# Patient Record
Sex: Male | Born: 1995 | Race: White | Hispanic: No | Marital: Single | Smoking: Never smoker
Health system: Southern US, Community
[De-identification: ages and names within clinical notes are randomized; demographics above are authoritative.]

## PROBLEM LIST (undated history)

## (undated) DIAGNOSIS — F23 Brief psychotic disorder: Secondary | ICD-10-CM

---
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
## 2017-07-28 ENCOUNTER — Encounter (HOSPITAL_COMMUNITY): Payer: Self-pay | Admitting: Emergency Medicine

## 2017-07-28 ENCOUNTER — Emergency Department (HOSPITAL_COMMUNITY)
Admission: EM | Admit: 2017-07-28 | Discharge: 2017-07-28 | Discharge status: Against Medical Advice | Payer: Commercial Managed Care - PPO | Attending: Emergency Medicine | Admitting: Emergency Medicine

## 2017-07-28 DIAGNOSIS — Z5321 Procedure and treatment not carried out due to patient leaving prior to being seen by health care provider: Secondary | ICD-10-CM | POA: Diagnosis not present

## 2017-07-28 DIAGNOSIS — R42 Dizziness and giddiness: Secondary | ICD-10-CM | POA: Diagnosis present

## 2017-07-28 NOTE — ED Triage Notes (Signed)
Pt reports feeling lightheaded and having "tunnel vision" at 10 am today while sitting in class, pt reports he has had episodes of this before, also reports headache after. Pt states symptoms have subsided. A/ox4, resp e/u, nad.

## 2017-07-28 NOTE — ED Notes (Signed)
Family stated that patient did not want to be seen. He is with his family and in the car. Reports that they will come back.

## 2019-09-28 ENCOUNTER — Ambulatory Visit: Payer: Commercial Managed Care - PPO | Admitting: Family Medicine

## 2020-01-30 ENCOUNTER — Encounter: Payer: Self-pay | Admitting: Dermatology

## 2020-01-30 ENCOUNTER — Other Ambulatory Visit: Payer: Self-pay

## 2020-01-30 ENCOUNTER — Ambulatory Visit (INDEPENDENT_AMBULATORY_CARE_PROVIDER_SITE_OTHER): Payer: Commercial Managed Care - PPO | Admitting: Dermatology

## 2020-01-30 DIAGNOSIS — L700 Acne vulgaris: Secondary | ICD-10-CM | POA: Diagnosis not present

## 2020-01-30 MED ORDER — ISOTRETINOIN 40 MG PO CAPS: 40.0000 mg | ORAL_CAPSULE | Freq: Every day | ORAL | 0 refills | Status: AC

## 2020-01-30 NOTE — Progress Notes (Signed)
   Follow-Up Visit   Subjective  Jacob [name] is a 24 y.o. male who presents for the following: Acne (31 day isotret f/u). Acne Patient is being treated with oral isotretinoin.  The patient has lesions in the following areas: face. The patient reports the following side effects: dry lips.  Oral isotretinoin dose is 40mg  daily.   The following portions of the chart were reviewed this encounter and updated as appropriate: Allergies  Meds  Problems  Med Hx  Surg Hx  Fam Hx      Isotretinoin F/U - 01/30/20 0700      Isotretinoin Follow Up   iPledge #  02/01/20   ipledge update "Qualified"   Date  01/30/20    Acne breakouts since last visit?  No      Dosage   Target Dosage (mg)  8181    Current (To Date) Dosage (mg)  4800    To Go Dosage (mg)  3381      Side Effects   Skin  Dry Lips    Gastrointestinal  WNL    Neurological  WNL    Constitutional  WNL    Other Side Effects  no       Objective  Well appearing patient in no apparent distress; mood and affect are within normal limits.  A focused examination was performed including face. Relevant physical exam findings are noted in the Assessment and Plan.  Assessment & Plan  Acne vulgaris (2) Left Buccal Cheek ; Right Buccal Cheek   Return one month.  ISOtretinoin (ACCUTANE) 40 MG capsule - Left Buccal Cheek , Right Buccal Cheek  Patient is compliant with his medication takes his isotretinoin with supper daily examination showed essentially complete clearance of his acne but severe cheilitis of his lips.  Patient encouraged to use daily lip balm.  Discussed use of sunscreen as we get into sunnier months.  No change in dosing.  No active affective mood disorders reported.

## 2020-03-05 ENCOUNTER — Ambulatory Visit: Payer: Commercial Managed Care - PPO | Admitting: Dermatology

## 2020-03-12 ENCOUNTER — Encounter: Payer: Self-pay | Admitting: Dermatology

## 2020-03-12 ENCOUNTER — Ambulatory Visit (INDEPENDENT_AMBULATORY_CARE_PROVIDER_SITE_OTHER): Payer: Commercial Managed Care - PPO | Admitting: Dermatology

## 2020-03-12 ENCOUNTER — Other Ambulatory Visit: Payer: Self-pay

## 2020-03-12 DIAGNOSIS — L700 Acne vulgaris: Secondary | ICD-10-CM | POA: Diagnosis not present

## 2020-03-12 DIAGNOSIS — L906 Striae atrophicae: Secondary | ICD-10-CM

## 2020-03-12 MED ORDER — ISOTRETINOIN 40 MG PO CAPS: 40.0000 mg | ORAL_CAPSULE | Freq: Every day | ORAL | 0 refills | Status: DC

## 2020-03-12 NOTE — Progress Notes (Signed)
   Follow-Up Visit   Subjective  Jacob [name] is a 24 y.o. male who presents for the following: Acne (31 isotret f/u).  Acne Location: Face more than torso Duration: Several years Quality: Most clear Associated Signs/Symptoms: Modifying Factors: Isotretinoin Severity:  Timing: Context:   The following portions of the chart were reviewed this encounter and updated as appropriate:     Objective  Well appearing patient in no apparent distress; mood and affect are within normal limits.  A focused examination was performed including face, neck, arms, back, chest. Relevant physical exam findings are noted in the Assessment and Plan. Active acne is essentially clear on the face and torso.  The pink horizontal lines on his lower mid back represent striae (stretch marks).  These will tend to slowly fade over several years.  If they do not, we discussed the possibility post Accutane of using some topical tretinoin.  Emphasized need for sunscreen while on isotretinoin during the spring and summer.  Assessment & Plan  continue isotretinoin

## 2020-03-14 ENCOUNTER — Ambulatory Visit: Payer: Commercial Managed Care - PPO | Admitting: Dermatology

## 2020-03-15 ENCOUNTER — Encounter: Payer: Self-pay | Admitting: Dermatology

## 2020-04-16 ENCOUNTER — Encounter: Payer: Self-pay | Admitting: Dermatology

## 2020-04-16 ENCOUNTER — Ambulatory Visit (INDEPENDENT_AMBULATORY_CARE_PROVIDER_SITE_OTHER): Payer: Commercial Managed Care - PPO | Admitting: Dermatology

## 2020-04-16 ENCOUNTER — Other Ambulatory Visit: Payer: Self-pay

## 2020-04-16 DIAGNOSIS — L700 Acne vulgaris: Secondary | ICD-10-CM | POA: Diagnosis not present

## 2020-04-16 MED ORDER — ISOTRETINOIN 40 MG PO CAPS: 40.0000 mg | ORAL_CAPSULE | Freq: Every day | ORAL | 0 refills | Status: AC

## 2020-04-22 ENCOUNTER — Encounter: Payer: Self-pay | Admitting: Dermatology

## 2020-04-22 NOTE — Progress Notes (Signed)
   Follow-Up Visit   Subjective  Jacob [name] is a 24 y.o. male who presents for the following: Follow-up (31 days).  Acne Location: Face Duration: Years Quality: Proved Associated Signs/Symptoms: Modifying Factors: Isotretinoin Severity:  Timing: Context:   The following portions of the chart were reviewed this encounter and updated as appropriate: Tobacco  Allergies  Meds  Problems  Med Hx  Surg Hx  Fam Hx      Objective  Well appearing patient in no apparent distress; mood and affect are within normal limits.  A focused examination was performed including Head, neck, and upper torso.. Relevant physical exam findings are noted in the Assessment and Plan.   Assessment & Plan  Acne vulgaris (2) Head - Anterior (Face)  Continue isotretinoin to target dose of at least 120 mg/kg body weight.  Sun protect.  Call with any problems.

## 2020-05-21 ENCOUNTER — Ambulatory Visit: Payer: Commercial Managed Care - PPO | Admitting: Dermatology

## 2023-05-09 ENCOUNTER — Emergency Department (HOSPITAL_COMMUNITY): Payer: Commercial Managed Care - PPO

## 2023-05-09 ENCOUNTER — Emergency Department (HOSPITAL_COMMUNITY)
Admission: EM | Admit: 2023-05-09 | Discharge: 2023-05-11 | Disposition: A | Discharge status: Home / Self Care | Payer: Commercial Managed Care - PPO | Attending: Emergency Medicine | Admitting: Emergency Medicine

## 2023-05-09 ENCOUNTER — Other Ambulatory Visit: Payer: Self-pay

## 2023-05-09 ENCOUNTER — Encounter (HOSPITAL_COMMUNITY): Payer: Self-pay | Admitting: Emergency Medicine

## 2023-05-09 DIAGNOSIS — F23 Brief psychotic disorder: Secondary | ICD-10-CM | POA: Insufficient documentation

## 2023-05-09 DIAGNOSIS — R4182 Altered mental status, unspecified: Secondary | ICD-10-CM | POA: Diagnosis present

## 2023-05-09 DIAGNOSIS — X58XXXA Exposure to other specified factors, initial encounter: Secondary | ICD-10-CM | POA: Insufficient documentation

## 2023-05-09 DIAGNOSIS — S40212A Abrasion of left shoulder, initial encounter: Secondary | ICD-10-CM | POA: Insufficient documentation

## 2023-05-09 DIAGNOSIS — Z91040 Latex allergy status: Secondary | ICD-10-CM | POA: Insufficient documentation

## 2023-05-09 DIAGNOSIS — F12159 Cannabis abuse with psychotic disorder, unspecified: Secondary | ICD-10-CM | POA: Diagnosis not present

## 2023-05-09 DIAGNOSIS — F419 Anxiety disorder, unspecified: Secondary | ICD-10-CM | POA: Diagnosis not present

## 2023-05-09 DIAGNOSIS — F12959 Cannabis use, unspecified with psychotic disorder, unspecified: Secondary | ICD-10-CM | POA: Diagnosis present

## 2023-05-09 DIAGNOSIS — S4992XA Unspecified injury of left shoulder and upper arm, initial encounter: Secondary | ICD-10-CM | POA: Diagnosis present

## 2023-05-09 LAB — URINALYSIS, ROUTINE W REFLEX MICROSCOPIC
Bilirubin Urine: NEGATIVE
Glucose, UA: NEGATIVE mg/dL
Hgb urine dipstick: NEGATIVE
Ketones, ur: 20 mg/dL — AB
Leukocytes,Ua: NEGATIVE
Nitrite: NEGATIVE
Protein, ur: 30 mg/dL — AB
Specific Gravity, Urine: 1.011 (ref 1.005–1.030)
pH: 6 (ref 5.0–8.0)

## 2023-05-09 LAB — CBC WITH DIFFERENTIAL/PLATELET
Abs Immature Granulocytes: 0.06 10*3/uL (ref 0.00–0.07)
Basophils Absolute: 0 10*3/uL (ref 0.0–0.1)
Basophils Relative: 0 %
Eosinophils Absolute: 0 10*3/uL (ref 0.0–0.5)
Eosinophils Relative: 0 %
HCT: 44.1 % (ref 39.0–52.0)
Hemoglobin: 15.1 g/dL (ref 13.0–17.0)
Immature Granulocytes: 0 %
Lymphocytes Relative: 14 %
Lymphs Abs: 2 10*3/uL (ref 0.7–4.0)
MCH: 29.9 pg (ref 26.0–34.0)
MCHC: 34.2 g/dL (ref 30.0–36.0)
MCV: 87.3 fL (ref 80.0–100.0)
Monocytes Absolute: 1.1 10*3/uL — ABNORMAL HIGH (ref 0.1–1.0)
Monocytes Relative: 8 %
Neutro Abs: 11.3 10*3/uL — ABNORMAL HIGH (ref 1.7–7.7)
Neutrophils Relative %: 78 %
Platelets: 198 10*3/uL (ref 150–400)
RBC: 5.05 MIL/uL (ref 4.22–5.81)
RDW: 11.8 % (ref 11.5–15.5)
WBC: 14.5 10*3/uL — ABNORMAL HIGH (ref 4.0–10.5)
nRBC: 0 % (ref 0.0–0.2)

## 2023-05-09 LAB — COMPREHENSIVE METABOLIC PANEL
ALT: 14 U/L (ref 0–44)
AST: 19 U/L (ref 15–41)
Albumin: 5.1 g/dL — ABNORMAL HIGH (ref 3.5–5.0)
Alkaline Phosphatase: 46 U/L (ref 38–126)
Anion gap: 12 (ref 5–15)
BUN: 12 mg/dL (ref 6–20)
CO2: 24 mmol/L (ref 22–32)
Calcium: 9.8 mg/dL (ref 8.9–10.3)
Chloride: 102 mmol/L (ref 98–111)
Creatinine, Ser: 1.08 mg/dL (ref 0.61–1.24)
GFR, Estimated: >60 mL/min (ref >60)
Glucose, Bld: 98 mg/dL (ref 70–99)
Potassium: 3.5 mmol/L (ref 3.5–5.1)
Sodium: 138 mmol/L (ref 135–145)
Total Bilirubin: 1.4 mg/dL — ABNORMAL HIGH (ref 0.3–1.2)
Total Protein: 8 g/dL (ref 6.5–8.1)

## 2023-05-09 LAB — MAGNESIUM: Magnesium: 2.7 mg/dL — ABNORMAL HIGH (ref 1.7–2.4)

## 2023-05-09 LAB — RAPID URINE DRUG SCREEN, HOSP PERFORMED
Amphetamines: NOT DETECTED
Barbiturates: NOT DETECTED
Benzodiazepines: NOT DETECTED
Cocaine: NOT DETECTED
Opiates: NOT DETECTED
Tetrahydrocannabinol: POSITIVE — AB

## 2023-05-09 LAB — ACETAMINOPHEN LEVEL: Acetaminophen (Tylenol), Serum: <10 ug/mL — ABNORMAL LOW (ref 10–30)

## 2023-05-09 LAB — ETHANOL: Alcohol, Ethyl (B): <10 mg/dL (ref <10)

## 2023-05-09 LAB — SALICYLATE LEVEL: Salicylate Lvl: <7 mg/dL — ABNORMAL LOW (ref 7.0–30.0)

## 2023-05-09 MED ORDER — ZIPRASIDONE MESYLATE 20 MG IM SOLR
20.0000 mg | Freq: Once | INTRAMUSCULAR | Status: AC
  Administered 2023-05-09: 20 mg via INTRAMUSCULAR
  Filled 2023-05-09: qty 20

## 2023-05-09 MED ORDER — LORAZEPAM 1 MG PO TABS
1.0000 mg | ORAL_TABLET | ORAL | Status: AC | PRN
  Administered 2023-05-09: 1 mg via ORAL
  Filled 2023-05-09: qty 1

## 2023-05-09 MED ORDER — STERILE WATER FOR INJECTION IJ SOLN
INTRAMUSCULAR | Status: AC
  Administered 2023-05-09: 10 mL
  Filled 2023-05-09: qty 10

## 2023-05-09 MED ORDER — ZIPRASIDONE MESYLATE 20 MG IM SOLR
20.0000 mg | INTRAMUSCULAR | Status: AC | PRN
  Administered 2023-05-09: 20 mg via INTRAMUSCULAR
  Filled 2023-05-09: qty 20

## 2023-05-09 MED ORDER — NICOTINE 21 MG/24HR TD PT24
21.0000 mg | MEDICATED_PATCH | Freq: Once | TRANSDERMAL | Status: AC
  Administered 2023-05-09: 21 mg via TRANSDERMAL
  Filled 2023-05-09: qty 1

## 2023-05-09 MED ORDER — STERILE WATER FOR INJECTION IJ SOLN
INTRAMUSCULAR | Status: AC
  Administered 2023-05-09: 1 mL
  Filled 2023-05-09: qty 10

## 2023-05-09 MED ORDER — LACTATED RINGERS IV BOLUS
1000.0000 mL | Freq: Once | INTRAVENOUS | Status: AC
  Administered 2023-05-09: 1000 mL via INTRAVENOUS

## 2023-05-09 MED ORDER — QUETIAPINE FUMARATE 50 MG PO TABS
50.0000 mg | ORAL_TABLET | Freq: Every day | ORAL | Status: DC
  Administered 2023-05-09 – 2023-05-10 (×2): 50 mg via ORAL
  Filled 2023-05-09 (×2): qty 1

## 2023-05-09 MED ORDER — OLANZAPINE 10 MG PO TBDP
10.0000 mg | ORAL_TABLET | Freq: Three times a day (TID) | ORAL | Status: DC | PRN
  Administered 2023-05-09 – 2023-05-10 (×3): 10 mg via ORAL
  Filled 2023-05-09 (×3): qty 1

## 2023-05-09 NOTE — ED Triage Notes (Signed)
Pt arrives w/ [employer] c/o psychotic episode. Was outside naked. Tearing up items in parents house.

## 2023-05-09 NOTE — ED Notes (Signed)
Patient exposing himself to the safety sitter, he ejaculated twice while in her presence.

## 2023-05-09 NOTE — ED Notes (Addendum)
Pt requested to call his dad. I told him in order to do that he needed to calm down. Pt has been pacing and starring at the door. He has been standing in the doorway and nervously looking around. Pt would have to come to the nurse desk to make a call.

## 2023-05-09 NOTE — ED Provider Notes (Signed)
[place] EMERGENCY DEPARTMENT AT [facility] Provider Note   CSN: [id number] Arrival date & time: 05/09/23  4098     History  Chief Complaint  Patient presents with   Psychiatric Evaluation   IVC    Mukhtar Shams is a 27 y.o. male.  HPI Patient presents for altered mental status.  Medical history includes anxiety, depression.  Police were called to his home this morning due to strange behavior.  Patient currently lives with his parents.  He does take psychiatric medications, which parents report he has been compliant to.  Patient was destroying items in his home.  He was subsequently found outside running around naked.  He arrives in the ED via PD.  Patient currently denies any physical complaints.  He endorses marijuana use but denies any recently.  He does endorse auditory hallucinations.  History per father: Patient has had similar episodes in the past.  Patient was in his normal state of health last week.  He went out of town with some friends.  On Friday, he called home and did not sound like his normal self.  He was brought back home yesterday and was able to sleep.  He woke up early this morning screaming about his wallet.  He then proceeded to have severe agitation and destructive behaviors.  Patient is not currently working or going to school.     Home Medications Prior to Admission medications   Medication Sig Start Date End Date Taking? Authorizing Provider  ISOtretinoin (CLARAVIS) 40 MG capsule Take 1 capsule (40 mg total) by mouth daily. 03/12/20   Janalyn Harder, MD      Allergies    Latex    Review of Systems   Review of Systems  Psychiatric/Behavioral:  Positive for agitation, behavioral problems and hallucinations. The patient is nervous/anxious.   All other systems reviewed and are negative.   Physical Exam Updated Vital Signs BP (!) 148/93 (BP Location: Left Arm)   Pulse 97   Temp 98.1 F (36.7 C) (Oral)   Resp 19   SpO2 97%  Physical  Exam Vitals and nursing note reviewed.  Constitutional:      General: He is not in acute distress.    Appearance: Normal appearance. He is well-developed. He is not ill-appearing, toxic-appearing or diaphoretic.  HENT:     Head: Normocephalic and atraumatic.     Right Ear: External ear normal.     Left Ear: External ear normal.     Nose: Nose normal.     Mouth/Throat:     Mouth: Mucous membranes are moist.  Eyes:     Extraocular Movements: Extraocular movements intact.     Conjunctiva/sclera: Conjunctivae normal.  Cardiovascular:     Rate and Rhythm: Normal rate and regular rhythm.  Pulmonary:     Effort: Pulmonary effort is normal. No respiratory distress.  Abdominal:     General: There is no distension.     Palpations: Abdomen is soft.     Tenderness: There is no abdominal tenderness.  Musculoskeletal:        General: No swelling. Normal range of motion.     Cervical back: Normal range of motion and neck supple.     Right lower leg: No edema.     Left lower leg: No edema.  Skin:    General: Skin is warm and dry.     Coloration: Skin is not jaundiced or pale.     Findings: No bruising.     Comments: Abrasions  to left shoulder  Neurological:     General: No focal deficit present.     Mental Status: He is alert and oriented to person, place, and time.     Cranial Nerves: No cranial nerve deficit.     Sensory: No sensory deficit.     Motor: No weakness.     Coordination: Coordination normal.  Psychiatric:        Attention and Perception: He perceives auditory hallucinations.        Mood and Affect: Mood is anxious.        Speech: Speech normal. Speech is not delayed, slurred or tangential.        Behavior: Behavior is agitated. Behavior is not aggressive or hyperactive. Behavior is cooperative.     ED Results / Procedures / Treatments   Labs (all labs ordered are listed, but only abnormal results are displayed) Labs Reviewed  COMPREHENSIVE METABOLIC PANEL - Abnormal;  Notable for the following components:      Result Value   Albumin 5.1 (*)    Total Bilirubin 1.4 (*)    All other components within normal limits  CBC WITH DIFFERENTIAL/PLATELET - Abnormal; Notable for the following components:   WBC 14.5 (*)    Neutro Abs 11.3 (*)    Monocytes Absolute 1.1 (*)    All other components within normal limits  SALICYLATE LEVEL - Abnormal; Notable for the following components:   Salicylate Lvl <7.0 (*)    All other components within normal limits  ACETAMINOPHEN LEVEL - Abnormal; Notable for the following components:   Acetaminophen (Tylenol), Serum <10 (*)    All other components within normal limits  MAGNESIUM - Abnormal; Notable for the following components:   Magnesium 2.7 (*)    All other components within normal limits  ETHANOL  RAPID URINE DRUG SCREEN, HOSP PERFORMED  URINALYSIS, ROUTINE W REFLEX MICROSCOPIC    EKG EKG Interpretation  Date/Time:  Sunday May 09 2023 06:50:18 EDT Ventricular Rate:  95 PR Interval:  139 QRS Duration: 97 QT Interval:  345 QTC Calculation: 434 R Axis:   80 Text Interpretation: Sinus rhythm Biatrial enlargement RSR' in V1 or V2, probably normal variant Confirmed by Alona Bene [id number]) on 05/09/2023 6:56:50 AM  Radiology CT Head Wo Contrast  Result Date: 05/09/2023 CLINICAL DATA:  Mental status change. EXAM: CT HEAD WITHOUT CONTRAST TECHNIQUE: Contiguous axial images were obtained from the base of the skull through the vertex without intravenous contrast. RADIATION DOSE REDUCTION: This exam was performed according to the departmental dose-optimization program which includes automated exposure control, adjustment of the mA and/or kV according to patient size and/or use of iterative reconstruction technique. COMPARISON:  None Available. FINDINGS: Brain: No evidence of acute infarction, hemorrhage, hydrocephalus, extra-axial collection or mass lesion/mass effect. Vascular: No hyperdense vessel or unexpected  calcification. Skull: Normal. Negative for fracture or focal lesion. Sinuses/Orbits: No acute finding. Other: None. IMPRESSION: No acute intracranial pathology. Electronically Signed   By: Signa Kell M.D.   On: 05/09/2023 08:44    Procedures Procedures    Medications Ordered in ED Medications  OLANZapine zydis (ZYPREXA) disintegrating tablet 10 mg (10 mg Oral Given 05/09/23 0805)    And  LORazepam (ATIVAN) tablet 1 mg (has no administration in time range)    And  ziprasidone (GEODON) injection 20 mg (has no administration in time range)  lactated ringers bolus 1,000 mL (1,000 mLs Intravenous New Bag/Given 05/09/23 6045)    ED Course/ Medical Decision Making/ A&P  Medical Decision Making Amount and/or Complexity of Data Reviewed Labs: ordered. Radiology: ordered.  Risk Prescription drug management.   This patient presents to the ED for concern of altered mental status, this involves an extensive number of treatment options, and is a complaint that carries with it a high risk of complications and morbidity.  The differential diagnosis includes intoxication, drug-induced psychosis, schizophrenia, TBI   Co morbidities that complicate the patient evaluation  Anxiety, depression   Additional history obtained:  Additional history obtained from police, patient's father External records from outside source obtained and reviewed including EMR   Lab Tests:  I Ordered, and personally interpreted labs.  The pertinent results include: Hypomagnesemia with otherwise normal electrolytes, normal hemoglobin, negative alcohol level.  A leukocytosis is present.   Imaging Studies ordered:  I ordered imaging studies including CT head I independently visualized and interpreted imaging which showed no acute findings I agree with the radiologist interpretation   Cardiac Monitoring: / EKG:  The patient was maintained on a cardiac monitor.  I personally  viewed and interpreted the cardiac monitored which showed an underlying rhythm of: Sinus rhythm   Consultations Obtained:  I requested consultation with the TTS,  and discussed lab and imaging findings as well as pertinent plan - they recommend: (Recommendations pending)   Problem List / ED Course / Critical interventions / Medication management  Patient presents for altered behavior.  Law enforcement, who brought him to the ED, reports that he was destroying items in his parents home, where he lives, as well as running outside naked.  On arrival in the ED, patient is awake and alert.  He does appear to be actively responding to internal stimuli.  He does endorse current auditory hallucinations.  Presentation is consistent with acute psychotic episode.  Workup was initiated for medical clearance.  IVC paperwork was completed.  Patient was noted bedside by his father, who was able to provide further history.  Seems like patient was recently out of town with some friends.  It was while out of town that he developed some changes in his behavior.  This was first noticed over telephone on Friday (2 days ago).  Father reports that he has a similar episodes in the past.  Labwork is notable for nonspecific leukocytosis.  Vital signs remain normal.  Patient is medically clear at this time.  TTS was consulted. I ordered medication including IV fluids for hydration; Geodon for agitation and hallucinations Reevaluation of the patient after these medicines showed that the patient improved I have reviewed the patients home medicines and have made adjustments as needed   Social Determinants of Health:  Lives at home with parents         Final Clinical Impression(s) / ED Diagnoses Final diagnoses:  Acute psychosis (HCC)    Rx / DC Orders ED Discharge Orders     None         Gloris Manchester, MD 05/09/23 [phone]

## 2023-05-09 NOTE — ED Notes (Signed)
Security is in pts room. Pt is agitated and upset because he wants to go home.

## 2023-05-09 NOTE — ED Notes (Signed)
Pt asking to go to store or walk around department. Informed he was not allowed. Pt closed door to room, flipped off this RN. Security called for standby assistance. Pt reopened door and hovering in doorway.

## 2023-05-09 NOTE — ED Notes (Signed)
Pt exposed himself to male staff members. Instructed to cease behavior

## 2023-05-09 NOTE — ED Notes (Signed)
Patient requesting "something to help him sleep".  Patient also requesting a toothbrush.  Patient is less agitated, responds appropriately and is cooperative.  Toothbrush and toothpaste provider to patient.  Safety sitter at bedside

## 2023-05-09 NOTE — ED Notes (Signed)
Patient's mom updated on visitor's policy.  Patient's mom states that she understands the policy and she understands that her son needs to stay here to be safe.

## 2023-05-09 NOTE — ED Notes (Signed)
Pt left wrist release from handcuffs for bloodwork and fluids. Right wrist still cuffed to stretcher. Pt compliant at this time.

## 2023-05-09 NOTE — ED Notes (Signed)
Pt ankles released from cuffs. Remains compliant at this time.

## 2023-05-09 NOTE — Consult Note (Signed)
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 male patient admitted with previous hx of Depression and anxiety was brought in by [facility] called by his father after patient was found Naked outside their home and tearing up things around the house.  Marland Kitchen  HPI:  Patient does not have much record to review except a visit in 2019 at [facility] where he was diagnosed with Anxiety and Depression.  At that time in 2019 he prescribed Zoloft..  Patient was seen in the room this morning confused and disorganized.  He denies ever been seen or diagnosed with Mental illness and denied ever taking any Psychotropic medications.  Patient does not remember what led to his coming to the ER and by who.  He looks around the room constantly and laughs inappropriately.  Patient was on Hand cuff on arrival but same has been off.  Patient  exposed his Penis to RN once and wanted to leave the ER but security came by to just monitor the situation.  Patient reports staying up all night and states he does not eat healthy and he eats once a day.  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  He denies illicit drug use but UDS is positive for Cannabis. Collateral from both parents are that this is the third episode of mental breakdown.  They reported that patient suddenly became violent at home, running around outside of their home naked.  His PCP prescribed Quetiapine 100 mg at night for him.  He has no Psychiatrist in the community. We will monitor overnight and reevaluate in am.  Quetiapine 50 mg nightly  will be resumed. Past Psychiatric History: Depression and anxiety  Risk to Self or Others: Is the patient at risk to self? No Has the patient been a risk to self in the past 6 months? No Has the patient been a risk to self within the distant past? No Is the patient a risk to others? No Has the patient been a risk to others in the past 6 months? No Has the patient been a risk to others within the distant past? No  [place] Scale:   AIMS:  , , ,  ,   ASAM:    Substance Abuse:     Past Medical History: History reviewed. No pertinent past medical history. History reviewed. No pertinent surgical history. Family History: History reviewed. No pertinent family history. Family Psychiatric  History: none Social History:  Social History   Substance and Sexual Activity  Alcohol Use None     Social History   Substance and Sexual Activity  Drug Use No    Social History   Socioeconomic History   Marital status: Single    Spouse name: Not on file   Number of children: Not on file   Years of education: Not on file   Highest education level: Not on file  Occupational History   Not on file  Tobacco Use   Smoking status: Never   Smokeless tobacco: Never  Vaping Use   Vaping Use: Every day  Substance and Sexual Activity  Alcohol use: Not on file   Drug use: No   Sexual activity: Not on file  Other Topics Concern   Not on file  Social History Narrative   Not on file   Social Determinants of Health   Financial Resource Strain: Not on file  Food Insecurity: Not on file  Transportation Needs: Not on file  Physical Activity: Not on file  Stress: Not on file  Social Connections: Not on file   Additional Social History:    Allergies:   Allergies  Allergen Reactions   Latex Rash    Labs:  Results for orders placed or performed during the hospital encounter of 05/09/23 (from the past 48 hour(s))  Comprehensive metabolic panel     Status: Abnormal   Collection Time:  05/09/23  8:38 AM  Result Value Ref Range   Sodium 138 135 - 145 mmol/L   Potassium 3.5 3.5 - 5.1 mmol/L   Chloride 102 98 - 111 mmol/L   CO2 24 22 - 32 mmol/L   Glucose, Bld 98 70 - 99 mg/dL    Comment: Glucose reference range applies only to samples taken after fasting for at least 8 hours.   BUN 12 6 - 20 mg/dL   Creatinine, Ser 1.91 0.61 - 1.24 mg/dL   Calcium 9.8 8.9 - 47.8 mg/dL   Total Protein 8.0 6.5 - 8.1 g/dL   Albumin 5.1 (H) 3.5 - 5.0 g/dL   AST 19 15 - 41 U/L   ALT 14 0 - 44 U/L   Alkaline Phosphatase 46 38 - 126 U/L   Total Bilirubin 1.4 (H) 0.3 - 1.2 mg/dL   GFR, Estimated >29 >56 mL/min    Comment: (NOTE) Calculated using the CKD-EPI Creatinine Equation (2021)    Anion gap 12 5 - 15    Comment: Performed at [facility], [address] [place], [place] [address]  Ethanol     Status: None   Collection Time: 05/09/23  8:38 AM  Result Value Ref Range   Alcohol, Ethyl (B) <10 <10 mg/dL    Comment: (NOTE) Lowest detectable limit for serum alcohol is 10 mg/dL.  For medical purposes only. Performed at [facility], [address] [place], [place] [address]   CBC with Diff     Status: Abnormal   Collection Time: 05/09/23  8:38 AM  Result Value Ref Range   WBC 14.5 (H) 4.0 - 10.5 K/uL   RBC 5.05 4.22 - 5.81 MIL/uL   Hemoglobin 15.1 13.0 - 17.0 g/dL   HCT 69.6 29.5 - 28.4 %   MCV 87.3 80.0 - 100.0 fL   MCH 29.9 26.0 - 34.0 pg   MCHC 34.2 30.0 - 36.0 g/dL   RDW 13.2 44.0 - 10.2 %   Platelets 198 150 - 400 K/uL   nRBC 0.0 0.0 - 0.2 %   Neutrophils Relative % 78 %   Neutro Abs 11.3 (H) 1.7 - 7.7 K/uL   Lymphocytes Relative 14 %   Lymphs Abs 2.0 0.7 - 4.0 K/uL   Monocytes Relative 8 %   Monocytes Absolute 1.1 (H) 0.1 - 1.0 K/uL   Eosinophils Relative 0 %   Eosinophils Absolute 0.0 0.0 - 0.5 K/uL   Basophils Relative 0 %   Basophils Absolute 0.0 0.0 - 0.1 K/uL   Immature Granulocytes 0 %   Abs Immature Granulocytes  0.06 0.00 - 0.07 K/uL    Comment: Performed at [facility], [address] [place], [place] [address]  Salicylate level     Status: Abnormal   Collection Time: 05/09/23  8:38 AM  Result Value Ref Range   Salicylate Lvl <7.0 (L) 7.0 - 30.0 mg/dL    Comment: Performed at [facility], [address], [place], [place] [address]  Acetaminophen level     Status: Abnormal   Collection Time: 05/09/23  8:38 AM  Result Value Ref Range   Acetaminophen (Tylenol), Serum <10 (L) 10 - 30 ug/mL    Comment: (NOTE) Therapeutic concentrations vary significantly. A range of 10-30 ug/mL  may be an effective concentration for many patients. However, some  are best treated at concentrations outside of this range. Acetaminophen concentrations >150 ug/mL at 4 hours after ingestion  and >50 ug/mL at 12 hours after ingestion are often associated with  toxic reactions.  Performed at [facility], [address] [place], [place] [address]   Magnesium     Status: Abnormal   Collection Time: 05/09/23  8:38 AM  Result Value Ref Range   Magnesium 2.7 (H) 1.7 - 2.4 mg/dL    Comment: Performed at [facility], [address] [place], [place] [address]  Urine rapid drug screen (hosp performed)     Status: Abnormal   Collection Time: 05/09/23  8:56 AM  Result Value Ref Range   Opiates NONE DETECTED NONE DETECTED   Cocaine NONE DETECTED NONE DETECTED   Benzodiazepines NONE DETECTED NONE DETECTED   Amphetamines NONE DETECTED NONE DETECTED   Tetrahydrocannabinol POSITIVE (A) NONE DETECTED   Barbiturates NONE DETECTED NONE DETECTED    Comment: (NOTE) DRUG SCREEN FOR MEDICAL PURPOSES ONLY.  IF CONFIRMATION IS NEEDED FOR ANY PURPOSE, NOTIFY LAB WITHIN 5 DAYS.  LOWEST DETECTABLE LIMITS FOR URINE DRUG SCREEN Drug Class                     Cutoff (ng/mL) Amphetamine and metabolites    1000 Barbiturate and metabolites     200 Benzodiazepine                 200 Opiates and metabolites        300 Cocaine and metabolites        300 THC                            50 Performed at [facility], [address], [place], [place] [address]   Urinalysis, Routine w reflex microscopic -Urine, Clean Catch     Status: Abnormal   Collection Time: 05/09/23  8:56 AM  Result Value Ref Range   Color, Urine YELLOW YELLOW   APPearance CLEAR CLEAR   Specific Gravity, Urine 1.011 1.005 - 1.030   pH 6.0 5.0 - 8.0   Glucose, UA NEGATIVE NEGATIVE mg/dL   Hgb urine dipstick NEGATIVE NEGATIVE   Bilirubin Urine NEGATIVE NEGATIVE   Ketones, ur 20 (A) NEGATIVE mg/dL   Protein, ur 30 (A) NEGATIVE mg/dL   Nitrite NEGATIVE NEGATIVE   Leukocytes,Ua NEGATIVE NEGATIVE   RBC / HPF 0-5 0 - 5 RBC/hpf   WBC, UA 0-5 0 - 5 WBC/hpf   Bacteria, UA RARE (A) NONE SEEN   Squamous Epithelial / HPF 0-5 0 - 5 /HPF   Mucus PRESENT    Hyaline Casts, UA PRESENT     Comment: Performed at [facility], [address], [place], [place] [address]  Current Facility-Administered Medications  Medication Dose Route Frequency Provider Last Rate Last Admin   nicotine (NICODERM CQ - dosed in mg/24 hours) patch 21 mg  21 mg Transdermal Once Gloris Manchester, MD   21 mg at 05/09/23 1105   OLANZapine zydis (ZYPREXA) disintegrating tablet 10 mg  10 mg Oral Q8H PRN Gloris Manchester, MD   10 mg at 05/09/23 0805   QUEtiapine (SEROQUEL) tablet 50 mg  50 mg Oral QHS Dahlia Byes C, NP       Current Outpatient Medications  Medication Sig Dispense Refill   ISOtretinoin (CLARAVIS) 40 MG capsule Take 1 capsule (40 mg total) by mouth daily. 30 capsule 0    Musculoskeletal: Strength & Muscle Tone: within normal limits Gait & Station: normal Patient leans: Front   Psychiatric Specialty Exam: Presentation  General Appearance:  Neat; Appropriate for Environment; Casual  Eye Contact: Fleeting  Speech: Blocked; Slow  Speech  Volume: Normal  Handedness: Right   Mood and Affect  Mood: Anxious  Affect: Congruent   Thought Process  Thought Processes: Disorganized  Descriptions of Associations:Tangential  Orientation:Partial  Thought Content:Illogical  History of Schizophrenia/Schizoaffective disorder:No data recorded Duration of Psychotic Symptoms:No data recorded Hallucinations:Hallucinations: None  Ideas of Reference:None  Suicidal Thoughts:Suicidal Thoughts: No  Homicidal Thoughts:Homicidal Thoughts: No   Sensorium  Memory: Immediate Poor; Recent Poor; Remote Poor  Judgment: Impaired  Insight: Lacking   Executive Functions  Concentration: Fair  Attention Span: Poor  Recall: Poor  Fund of Knowledge: Poor  Language: Poor   Psychomotor Activity  Psychomotor Activity: Psychomotor Activity: Normal   Assets  Assets: Manufacturing systems engineer; Housing; Physical Health    Sleep  Sleep: Sleep: Fair   Physical Exam: Physical Exam Vitals and nursing note reviewed.  Constitutional:      Appearance: Normal appearance.  HENT:     Head: Normocephalic.     Nose: Nose normal.  Cardiovascular:     Rate and Rhythm: Normal rate and regular rhythm.  Pulmonary:     Effort: Pulmonary effort is normal.  Musculoskeletal:        General: Normal range of motion.     Cervical back: Normal range of motion.  Skin:    General: Skin is warm and dry.  Neurological:     Mental Status: He is alert and oriented to person, place, and time.  Psychiatric:        Attention and Perception: He is inattentive.        Mood and Affect: Mood is anxious.        Speech: Speech normal.        Behavior: Behavior is uncooperative.        Cognition and Memory: Cognition is impaired. Memory is impaired.        Judgment: Judgment is inappropriate.    ROS-unable to participate, laughing inappropriately. Blood pressure (!) 148/93, pulse 97, temperature 98.1 F (36.7 C), temperature source  Oral, resp. rate 19, SpO2 97 %. There is no height or weight on file to calculate BMI.  Medical Decision Making: Patient denies SI/HI/AVH.  He is confused and  disorganized at this time.  This could be due to substance induced Psychosis.  We will reevaluate tomorrow am and determined appropriate disposition.  Problem 1: Altered  mental  status.  Problem 2: Cannabis-Induced Psychotic disorder  Disposition:  Monitor overnight and reevaluate in am.  Earney Navy, NP---PMHNP-BC 05/09/2023 12:13 PM

## 2023-05-09 NOTE — ED Notes (Signed)
Patient woke up, agitated, states he has to go home to make money.  Patient asking for his cell phone.  Patient re-oriented to his situation and to the policies applicable to patients who are IVC'd.  Patient states that he wants to speak with his mom but not on the corded phone.  Security at bedside

## 2023-05-10 MED ORDER — NICOTINE 14 MG/24HR TD PT24
14.0000 mg | MEDICATED_PATCH | Freq: Every day | TRANSDERMAL | Status: DC
  Administered 2023-05-10: 14 mg via TRANSDERMAL
  Filled 2023-05-10: qty 1

## 2023-05-10 MED ORDER — LORAZEPAM 1 MG PO TABS
2.0000 mg | ORAL_TABLET | Freq: Four times a day (QID) | ORAL | Status: DC | PRN
  Administered 2023-05-10 – 2023-05-11 (×2): 2 mg via ORAL
  Filled 2023-05-10 (×2): qty 2

## 2023-05-10 MED ORDER — DIPHENHYDRAMINE HCL 50 MG/ML IJ SOLN
50.0000 mg | Freq: Once | INTRAMUSCULAR | Status: AC
  Administered 2023-05-10: 50 mg via INTRAMUSCULAR
  Filled 2023-05-10: qty 1

## 2023-05-10 MED ORDER — LORAZEPAM 2 MG/ML IJ SOLN
2.0000 mg | Freq: Once | INTRAMUSCULAR | Status: AC
  Administered 2023-05-10: 2 mg via INTRAMUSCULAR
  Filled 2023-05-10: qty 1

## 2023-05-10 MED ORDER — DIPHENHYDRAMINE HCL 25 MG PO CAPS
50.0000 mg | ORAL_CAPSULE | Freq: Three times a day (TID) | ORAL | Status: DC | PRN
  Administered 2023-05-10 – 2023-05-11 (×2): 50 mg via ORAL
  Filled 2023-05-10 (×2): qty 2

## 2023-05-10 MED ORDER — HALOPERIDOL 5 MG PO TABS
5.0000 mg | ORAL_TABLET | Freq: Three times a day (TID) | ORAL | Status: DC | PRN
  Administered 2023-05-10 – 2023-05-11 (×2): 5 mg via ORAL
  Filled 2023-05-10 (×2): qty 1

## 2023-05-10 NOTE — ED Notes (Signed)
Security called to assist with medication administration.  Support and encouragment offered and patient took injections without incident.

## 2023-05-10 NOTE — ED Notes (Signed)
Patient jumping up and down running around unit.

## 2023-05-10 NOTE — Progress Notes (Signed)
LCSW Progress Note  [id number]   Jacob [name]  05/10/2023  3:10 PM  Description:   Inpatient Psychiatric Referral  Patient was recommended inpatient per Phebe Colla, NP. There are no available beds at [facility], per [facility] Malva Limes, RN. Patient was referred to the following out of network facilities:   Destination  Service Provider Address Phone Fax  [facility]  [address], [place] [address] [phone]  [facility] [address]  [address] [place] [address] [phone]  [facility]  [address] [place], [place] [address] [phone]  [facility] [address] [place]  [address] [place] [address] [phone]  [facility] [place] [address] [phone]  [facility]  [address] [place] [address] [phone]  [facility]  [address] [place], [place] [address] [phone]  [facility]  [address] [place] [address] [phone]  [facility]  [address] [place] [address] [phone]  [facility]  [address] [place] [address] [phone]  [facility]  [address] [place] [address] [phone]  [facility]  [address], [place] [address] [phone]  [facility]  [address] [place] [address] [phone]  [facility]  [address], [place] [address] [phone]  [facility]  [address] [place] [address] [phone]  [facility]  [address], [place]  [address] [phone]  [facility]  [address], [place] [address] [phone]  [facility]  [address] [place], [place] [address] [phone]  [facility]  [address], [place] [address] [phone]  [facility]  [address] [place] [address] [phone]  [facility]  [address] [place] [address] [phone]  [facility]  [address], [place] [address] [phone]  [facility]  [address], [place] [address] [phone]  [facility]  [address] [place] [address] [phone]    Situation ongoing, CSW to continue following and update chart as more information becomes available.      Cathie Beams, [place]  05/10/2023 3:10 PM

## 2023-05-10 NOTE — ED Notes (Signed)
Patient anxious and pacing the unit.  Multiple requests to use the telephone.  Unresponsive to redirection.

## 2023-05-10 NOTE — Progress Notes (Signed)
Pt was accepted to [facility] 05/11/2023, pending IVC paperwork faxed to [phone]. Bed assignment: Main campus  Pt meets inpatient criteria per Phebe Colla, NP  Attending Physician will be Loni Beckwith, MD  Report can be called to: [phone] (this is a pager, please leave call-back number when giving report)  Pt can arrive after 8 AM  Care Team Notified: [facility] Malva Limes, RN, Phebe Colla, and Lum Babe, RN  [place], [place]  05/10/2023 3:26 PM

## 2023-05-10 NOTE — ED Notes (Signed)
3 security guards and 1 [employer] officer on unit with patient.  Patient attempting to open unit doors, turning unit lights off and on.

## 2023-05-10 NOTE — ED Notes (Signed)
Per MD recommendation... Sausage, egg, and cheese given along with cheese and crackers.

## 2023-05-10 NOTE — ED Notes (Signed)
Patient now back in room 34.

## 2023-05-10 NOTE — ED Notes (Signed)
Patient pacing unit while brushing his teeth.

## 2023-05-10 NOTE — ED Notes (Signed)
Patient anxious.  Pacing around.  Wanting to see family.  Rambling at times. Patient behavior seems to be escalating .  PRN given (see MAR)

## 2023-05-10 NOTE — ED Notes (Signed)
Patient now lying in bed in room 37.

## 2023-05-10 NOTE — ED Notes (Addendum)
Patient at nurse's station attempting to break glass.

## 2023-05-10 NOTE — ED Notes (Signed)
Patient skipping up and down hall snapping his fingers.

## 2023-05-10 NOTE — ED Notes (Signed)
Patient attempted to enter nurse's station behind nurse.  Successfully redirected.

## 2023-05-10 NOTE — ED Notes (Signed)
Patient continues to walk around unit.  Patient thoughts are disorganized, paranoid and delusional at times.

## 2023-05-10 NOTE — ED Notes (Signed)
Received call from patient's mother inquiring about visitation an an update on patient's condition and overnight behavior.  Mother's plan is to visit today, however she is going to call day shift nurse or medic to check patient's state prior to coming.

## 2023-05-10 NOTE — ED Notes (Signed)
Patient upset,  anxious, yelling at times,  crying.  Patient redirect and reassured.

## 2023-05-10 NOTE — ED Notes (Signed)
Patient entered room 36 and is lying in bed.

## 2023-05-10 NOTE — ED Notes (Signed)
Patient attempting to remove badge scanner from the wall.

## 2023-05-10 NOTE — ED Notes (Signed)
Patient beating on glass at nurse's station demanding to use phone.

## 2023-05-10 NOTE — ED Provider Notes (Signed)
Emergency Medicine Observation Re-evaluation Note  Jacob [name] is a 27 y.o. male, seen on rounds today.  Pt initially presented to the ED for complaints of Psychiatric Evaluation and IVC Currently, the patient is awaiting psych reevaluation.  Physical Exam  BP (!) 154/89 (BP Location: Left Arm)   Pulse 93   Temp 98.1 F (36.7 C) (Oral)   Resp 20   SpO2 96%  Physical Exam Alert in no acute distress  ED Course / MDM  EKG:EKG Interpretation  Date/Time:  Sunday May 09 2023 06:50:18 EDT Ventricular Rate:  95 PR Interval:  139 QRS Duration: 97 QT Interval:  345 QTC Calculation: 434 R Axis:   80 Text Interpretation: Sinus rhythm Biatrial enlargement RSR' in V1 or V2, probably normal variant Confirmed by Alona Bene [phone]) on 05/09/2023 6:56:50 AM  I have reviewed the labs performed to date as well as medications administered while in observation.  Recent changes in the last 24 hours include none.  Plan  Current plan is for reevaluation by psych today.    Bethann Berkshire, MD 05/10/23 [phone]

## 2023-05-10 NOTE — ED Notes (Signed)
Patient attempting to stand on counter at nurse's station to climb wall dividing unit.  Patient successfully redirected.

## 2023-05-10 NOTE — Progress Notes (Signed)
[facility] Psych ED Progress Note  05/10/2023 2:35 PM Jacob [name]  MRN:  [id number]   Subjective:  Patient is seen today initially in his room.  Patient sits up on his bed with his knees to his chest and the blanket pulled up to his chin.  He says he is feeling confused and doesn't know how he got here.  When asked if he remembered taking his clothes off outside, he laughs and says "yes the voices told me that is how I would get the girl."  He endorses auditory hallucinations for the last 3 years, but refused to share the content.  In addition, he states he has visual hallucinations of the wind and pointed to a spot outside the door saying "it was just there."  He endorses suicidal ideation saying his gun is in his car.  He also endorses homicidal ideation saying there are 5 people he wants to kill.  When asked who he wants to kill, he just wags his finger in a back and forth motion and mouths the word "no."  He says he lives with his parents and younger sister.  He says he abruptly that he won't talk about his sister and that he hates her.  Between answering questions, patient would laugh oddly and seemed to respond to unseen others.     He endorses THC use and says he buys his marijuana from a dispensary.  He denies all other drug use. Patient began to demand that he be allowed to go home and to have his phone. Patient is assured the goal is to get him back to his family as soon as possible and he became more angry.  Patient has been intermittently irritable for more than 24 hours.  He has been banging on doors and windows, yelling and demanding to be let out.  He has received PRN medications last night and today. He screams out that staff are withholding food.  He runs up and down the hall, jumps up and down, rips the velcro doors down going in and out of each room.    Patient remains in need of psychiatric hospitalization for safety and stabilization.      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 No pertinent past medical history. History reviewed. No pertinent surgical history. Family History: History reviewed. No pertinent family history. Family Psychiatric  History: None Social History:  Social History   Substance and Sexual Activity  Alcohol Use None     Social History   Substance and Sexual Activity  Drug Use No    Social History   Socioeconomic History   Marital status: Single    Spouse name: Not on file   Number of children: Not on file   Years of education: Not on file   Highest education level: Not on file  Occupational History   Not on file  Tobacco Use   Smoking status: Never   Smokeless tobacco: Never  Vaping Use   Vaping Use: Every day  Substance and Sexual Activity   Alcohol use: Not on file   Drug use: No   Sexual activity: Not on file  Other Topics Concern   Not on file  Social History  Narrative   Not on file   Social Determinants of Health   Financial Resource Strain: Not on file  Food Insecurity: Not on file  Transportation Needs: Not on file  Physical Activity: Not on file  Stress: Not on file  Social Connections: Not on file    Sleep: Poor  Appetite:  Fair  Current Medications: Current Facility-Administered Medications  Medication Dose Route Frequency Provider Last Rate Last Admin   diphenhydrAMINE (BENADRYL) capsule 50 mg  50 mg Oral Q8H PRN Adysen Raphael A, NP   50 mg at 05/10/23 1428   haloperidol (HALDOL) tablet 5 mg  5 mg Oral Q8H PRN Nanette Wirsing A, NP   5 mg at 05/10/23 1428   LORazepam (ATIVAN) tablet 2 mg  2 mg Oral Q6H PRN Tyrihanna Wingert A, NP   2 mg at 05/10/23 1428   nicotine  (NICODERM CQ - dosed in mg/24 hours) patch 14 mg  14 mg Transdermal Daily Henlee Donovan A, NP   14 mg at 05/10/23 1431   OLANZapine zydis (ZYPREXA) disintegrating tablet 10 mg  10 mg Oral Q8H PRN Gloris Manchester, MD   10 mg at 05/10/23 0849   QUEtiapine (SEROQUEL) tablet 50 mg  50 mg Oral QHS Onuoha, Josephine C, NP   50 mg at 05/09/23 2018   Current Outpatient Medications  Medication Sig Dispense Refill   QUEtiapine (SEROQUEL XR) 50 MG TB24 24 hr tablet Take 100 mg by mouth at bedtime.     Vitamin D, Ergocalciferol, (DRISDOL) 1.25 MG (50000 UNIT) CAPS capsule Take 50,000 Units by mouth every 7 (seven) days.     ISOtretinoin (CLARAVIS) 40 MG capsule Take 1 capsule (40 mg total) by mouth daily. (Patient not taking: Reported on 05/09/2023) 30 capsule 0    Lab Results:  Results for orders placed or performed during the hospital encounter of 05/09/23 (from the past 48 hour(s))  Comprehensive metabolic panel     Status: Abnormal   Collection Time: 05/09/23  8:38 AM  Result Value Ref Range   Sodium 138 135 - 145 mmol/L   Potassium 3.5 3.5 - 5.1 mmol/L   Chloride 102 98 - 111 mmol/L   CO2 24 22 - 32 mmol/L   Glucose, Bld 98 70 - 99 mg/dL    Comment: Glucose reference range applies only to samples taken after fasting for at least 8 hours.   BUN 12 6 - 20 mg/dL   Creatinine, Ser 3.08 0.61 - 1.24 mg/dL   Calcium 9.8 8.9 - 65.7 mg/dL   Total Protein 8.0 6.5 - 8.1 g/dL   Albumin 5.1 (H) 3.5 - 5.0 g/dL   AST 19 15 - 41 U/L   ALT 14 0 - 44 U/L   Alkaline Phosphatase 46 38 - 126 U/L   Total Bilirubin 1.4 (H) 0.3 - 1.2 mg/dL   GFR, Estimated >84 >69 mL/min    Comment: (NOTE) Calculated using the CKD-EPI Creatinine Equation (2021)    Anion gap 12 5 - 15    Comment: Performed at [facility], [address], [place], [place] [address]  Ethanol     Status: None   Collection Time: 05/09/23  8:38 AM  Result Value Ref Range   Alcohol, Ethyl (B) <10 <10 mg/dL    Comment: (NOTE) Lowest  detectable limit for serum alcohol is 10 mg/dL.  For medical purposes only. Performed at [facility], [address] [place], [place] [address]   CBC with Diff  Status: Abnormal   Collection Time: 05/09/23  8:38 AM  Result Value Ref Range   WBC 14.5 (H) 4.0 - 10.5 K/uL   RBC 5.05 4.22 - 5.81 MIL/uL   Hemoglobin 15.1 13.0 - 17.0 g/dL   HCT 54.2 70.6 - 23.7 %   MCV 87.3 80.0 - 100.0 fL   MCH 29.9 26.0 - 34.0 pg   MCHC 34.2 30.0 - 36.0 g/dL   RDW 62.8 31.5 - 17.6 %   Platelets 198 150 - 400 K/uL   nRBC 0.0 0.0 - 0.2 %   Neutrophils Relative % 78 %   Neutro Abs 11.3 (H) 1.7 - 7.7 K/uL   Lymphocytes Relative 14 %   Lymphs Abs 2.0 0.7 - 4.0 K/uL   Monocytes Relative 8 %   Monocytes Absolute 1.1 (H) 0.1 - 1.0 K/uL   Eosinophils Relative 0 %   Eosinophils Absolute 0.0 0.0 - 0.5 K/uL   Basophils Relative 0 %   Basophils Absolute 0.0 0.0 - 0.1 K/uL   Immature Granulocytes 0 %   Abs Immature Granulocytes 0.06 0.00 - 0.07 K/uL    Comment: Performed at [facility], [address] [place], [place] [address]  Salicylate level     Status: Abnormal   Collection Time: 05/09/23  8:38 AM  Result Value Ref Range   Salicylate Lvl <7.0 (L) 7.0 - 30.0 mg/dL    Comment: Performed at [facility], [address] [place], [place] [address]  Acetaminophen level     Status: Abnormal   Collection Time: 05/09/23  8:38 AM  Result Value Ref Range   Acetaminophen (Tylenol), Serum <10 (L) 10 - 30 ug/mL    Comment: (NOTE) Therapeutic concentrations vary significantly. A range of 10-30 ug/mL  may be an effective concentration for many patients. However, some  are best treated at concentrations outside of this range. Acetaminophen concentrations >150 ug/mL at 4 hours after ingestion  and >50 ug/mL at 12 hours after ingestion are often associated with  toxic reactions.  Performed at [facility], [address] [place], [place] [address]   Magnesium     Status: Abnormal   Collection Time: 05/09/23  8:38 AM  Result Value Ref Range   Magnesium 2.7 (H) 1.7 - 2.4 mg/dL    Comment: Performed at [facility], [address] [place], [place] [address]  Urine rapid drug screen (hosp performed)     Status: Abnormal   Collection Time: 05/09/23  8:56 AM  Result Value Ref Range   Opiates NONE DETECTED NONE DETECTED   Cocaine NONE DETECTED NONE DETECTED   Benzodiazepines NONE DETECTED NONE DETECTED   Amphetamines NONE DETECTED NONE DETECTED   Tetrahydrocannabinol POSITIVE (A) NONE DETECTED   Barbiturates NONE DETECTED NONE DETECTED    Comment: (NOTE) DRUG SCREEN FOR MEDICAL PURPOSES ONLY.  IF CONFIRMATION IS NEEDED FOR ANY PURPOSE, NOTIFY LAB WITHIN 5 DAYS.  LOWEST DETECTABLE LIMITS FOR URINE DRUG SCREEN Drug Class                     Cutoff (ng/mL) Amphetamine and metabolites    1000 Barbiturate and metabolites    200 Benzodiazepine                 200 Opiates and metabolites        300 Cocaine and metabolites        300 THC  50 Performed at [facility], [address] [place], [place] [address]   Urinalysis, Routine w reflex microscopic -Urine, Clean Catch     Status: Abnormal   Collection Time: 05/09/23  8:56 AM  Result Value Ref Range   Color, Urine YELLOW YELLOW   APPearance CLEAR CLEAR   Specific Gravity, Urine 1.011 1.005 - 1.030   pH 6.0 5.0 - 8.0   Glucose, UA NEGATIVE NEGATIVE mg/dL   Hgb urine dipstick NEGATIVE NEGATIVE   Bilirubin Urine NEGATIVE NEGATIVE   Ketones, ur 20 (A) NEGATIVE mg/dL   Protein, ur 30 (A) NEGATIVE mg/dL   Nitrite NEGATIVE NEGATIVE   Leukocytes,Ua NEGATIVE NEGATIVE   RBC / HPF 0-5 0 - 5 RBC/hpf   WBC, UA 0-5 0 - 5 WBC/hpf   Bacteria, UA RARE (A) NONE SEEN   Squamous Epithelial / HPF 0-5 0 - 5 /HPF   Mucus PRESENT    Hyaline Casts, UA PRESENT     Comment: Performed at [facility], [address] [place], [place] [address]    Blood Alcohol level:  Lab Results  Component Value Date   [facility] <10 05/09/2023     Musculoskeletal: Strength & Muscle Tone: within normal limits Gait & Station: normal Patient leans: N/A  Psychiatric Specialty Exam:  Presentation  General Appearance:  Disheveled  Eye Contact: Fair  Speech: Clear and Coherent  Speech Volume: Normal  Handedness: Right   Mood and Affect  Mood: Labile; Anxious; Irritable  Affect: Labile   Thought Process  Thought Processes: Disorganized  Descriptions of Associations:Loose  Orientation:Full (Time, Place and Person)  Thought Content:Delusions  History of Schizophrenia/Schizoaffective disorder:No  Duration of Psychotic Symptoms:Greater than six months  Hallucinations:Hallucinations: Auditory Description of Auditory Hallucinations: Patient has been hearing voices for 3 years - will not share content  Ideas of Reference:Paranoia  Suicidal Thoughts:Suicidal Thoughts: Yes, Active SI Active Intent and/or Plan: Without Plan; Without Intent  Homicidal Thoughts:Homicidal Thoughts: Yes, Active HI Active Intent and/or Plan: With Intent; Without Plan   Sensorium  Memory: Immediate Poor; Recent Poor; Remote Poor  Judgment: Impaired  Insight: Lacking   Executive Functions  Concentration: Poor  Attention Span: Poor  Recall: Poor  Fund of Knowledge: Fair  Language: Fair   Psychomotor Activity  Psychomotor Activity: Psychomotor Activity: Restlessness   Assets  Assets: Social Support; Housing   Sleep  Sleep: Sleep: Poor    Physical Exam: Physical Exam Vitals and nursing note reviewed.  HENT:     Head: Normocephalic.  Eyes:     Pupils: Pupils are equal, round, and reactive to light.  Pulmonary:     Effort: Pulmonary effort is normal.  Skin:    General: Skin is dry.  Neurological:     Mental Status: He is alert and  oriented to person, place, and time.    Review of Systems  Psychiatric/Behavioral:  Positive for hallucinations and suicidal ideas.        Homicidal Ideation  All other systems reviewed and are negative.  Blood pressure (!) 154/89, pulse 93, temperature 98.1 F (36.7 C), temperature source Oral, resp. rate 20, SpO2 96 %. There is no height or weight on file to calculate BMI.   Medical Decision Making: Patient case reviewed and discussed with Dr Lucianne Muss.  Patient is suicidal, homicidal, confused and disorganized.  He requires inpatient psychiatric hospitalization for stabilization.   Problem 1: Altered Mental Status -Start haldol 5mg  PO Q 8 hours PRN for agitation -Start benadryl 50mg   PO Q 8 hours PRN for agitation -Start ativan 2mg  PO Q 8 hours PRN for agitation  Disposition:  Referral for inpatient psychiatric hospitalization.  Thomes Lolling, NP 05/10/2023, 2:35 PM

## 2023-05-10 NOTE — ED Notes (Addendum)
Patient punching walls and ripping velcro doors off.

## 2023-05-10 NOTE — ED Notes (Signed)
Patient set off code blue alarm in his room.

## 2023-05-10 NOTE — ED Notes (Signed)
Patient reaching hand through glass and nurse's station to grab telephone.  Compliant with redirection.

## 2023-05-10 NOTE — ED Notes (Signed)
Patient behavior is escalating... becoming verbally aggressive and argumentative toward staff. Multiple demands to use the telephone. Attempting to open doors on the unit. MD notified.

## 2023-05-10 NOTE — ED Notes (Signed)
Patient standing on counter at nurse's station looking over the wall.  Redirection successful.

## 2023-05-11 NOTE — ED Notes (Addendum)
Pt is being transported to [facility] today. Transport for pt was called and set up, they will call back with an ETA. [place] was contacted and report was given to Jeannene Patella, Charity fundraiser at [facility].

## 2023-05-11 NOTE — ED Notes (Signed)
Pt keeps asking to use the phone. Pt has been informed multiple times that the phones open at 10 am.

## 2023-05-11 NOTE — ED Notes (Signed)
EMTALA filled out and given to [facility].

## 2023-05-11 NOTE — ED Notes (Signed)
Pt is up at the window wanting to make a phone call and go home. Explained to pt that he could not go home and that calling hours started at 10 am. Pt was given warm blanket and stated that he was going to lay down.

## 2023-05-11 NOTE — ED Notes (Signed)
Straight to voicemail when attempting to call for report. RN left a voice mail to call back.

## 2023-05-11 NOTE — ED Notes (Signed)
Pt walked up to window wanting medication. Pt was informed that he is not due for medication right now. Pt walked back to room and laid down.

## 2023-05-11 NOTE — ED Notes (Signed)
Patieng getting more anxious and restless. PRN medicine given. Will continue to monitor.

## 2023-05-11 NOTE — Consult Note (Cosign Needed Addendum)
Patient was calm this morning and conversational with staff.  As soon as patient was informed that he will be going to [facility] patient became angry, and agitated stating he does not need to go to the hospital.  Patient requested to go to his grandmother's home.  Patient however needs inpatient Psychiatry care for treatment and stabilization due to bizarre behavior.  Patient occasionally disrobes or takes off his shirt.  He lacks insight in his Mental illness.  Patient needs constant reminder to put on his shirt or go back to his room.  He remains labile and snaps instantly when he does not get his way.  His behavior suddenly changed when told he is going to [facility].  Transportation is here to take him to [facility] for further inpatient Psychiatry treatment.

## 2023-05-19 ENCOUNTER — Other Ambulatory Visit: Payer: Self-pay

## 2023-05-19 ENCOUNTER — Emergency Department (HOSPITAL_BASED_OUTPATIENT_CLINIC_OR_DEPARTMENT_OTHER)
Admission: EM | Admit: 2023-05-19 | Discharge: 2023-05-20 | Disposition: A | Discharge status: Other Acute Inpatient Hospital | Payer: Commercial Managed Care - PPO | Source: Home / Self Care | Attending: Emergency Medicine | Admitting: Emergency Medicine

## 2023-05-19 ENCOUNTER — Emergency Department (HOSPITAL_BASED_OUTPATIENT_CLINIC_OR_DEPARTMENT_OTHER): Payer: Commercial Managed Care - PPO

## 2023-05-19 ENCOUNTER — Encounter (HOSPITAL_BASED_OUTPATIENT_CLINIC_OR_DEPARTMENT_OTHER): Payer: Self-pay | Admitting: Emergency Medicine

## 2023-05-19 DIAGNOSIS — F333 Major depressive disorder, recurrent, severe with psychotic symptoms: Secondary | ICD-10-CM | POA: Diagnosis not present

## 2023-05-19 DIAGNOSIS — R45851 Suicidal ideations: Secondary | ICD-10-CM | POA: Insufficient documentation

## 2023-05-19 DIAGNOSIS — F32A Depression, unspecified: Secondary | ICD-10-CM | POA: Insufficient documentation

## 2023-05-19 DIAGNOSIS — F411 Generalized anxiety disorder: Secondary | ICD-10-CM | POA: Insufficient documentation

## 2023-05-19 DIAGNOSIS — Z91040 Latex allergy status: Secondary | ICD-10-CM | POA: Insufficient documentation

## 2023-05-19 DIAGNOSIS — Z20822 Contact with and (suspected) exposure to covid-19: Secondary | ICD-10-CM | POA: Insufficient documentation

## 2023-05-19 DIAGNOSIS — F332 Major depressive disorder, recurrent severe without psychotic features: Secondary | ICD-10-CM | POA: Diagnosis not present

## 2023-05-19 HISTORY — DX: Brief psychotic disorder: F23

## 2023-05-19 LAB — ETHANOL: Alcohol, Ethyl (B): <10 mg/dL (ref <10)

## 2023-05-19 LAB — BASIC METABOLIC PANEL
Anion gap: 10 (ref 5–15)
BUN: 11 mg/dL (ref 6–20)
CO2: 23 mmol/L (ref 22–32)
Calcium: 9.4 mg/dL (ref 8.9–10.3)
Chloride: 103 mmol/L (ref 98–111)
Creatinine, Ser: 0.71 mg/dL (ref 0.61–1.24)
GFR, Estimated: >60 mL/min (ref >60)
Glucose, Bld: 120 mg/dL — ABNORMAL HIGH (ref 70–99)
Potassium: 3.8 mmol/L (ref 3.5–5.1)
Sodium: 136 mmol/L (ref 135–145)

## 2023-05-19 LAB — RAPID URINE DRUG SCREEN, HOSP PERFORMED
Amphetamines: NOT DETECTED
Barbiturates: NOT DETECTED
Benzodiazepines: NOT DETECTED
Cocaine: NOT DETECTED
Opiates: NOT DETECTED
Tetrahydrocannabinol: POSITIVE — AB

## 2023-05-19 LAB — CBC
HCT: 39.4 % (ref 39.0–52.0)
Hemoglobin: 13.7 g/dL (ref 13.0–17.0)
MCH: 30.6 pg (ref 26.0–34.0)
MCHC: 34.8 g/dL (ref 30.0–36.0)
MCV: 87.9 fL (ref 80.0–100.0)
Platelets: 212 10*3/uL (ref 150–400)
RBC: 4.48 MIL/uL (ref 4.22–5.81)
RDW: 11.7 % (ref 11.5–15.5)
WBC: 6.1 10*3/uL (ref 4.0–10.5)
nRBC: 0 % (ref 0.0–0.2)

## 2023-05-19 LAB — URINALYSIS, ROUTINE W REFLEX MICROSCOPIC
Bilirubin Urine: NEGATIVE
Glucose, UA: NEGATIVE mg/dL
Hgb urine dipstick: NEGATIVE
Ketones, ur: NEGATIVE mg/dL
Leukocytes,Ua: NEGATIVE
Nitrite: NEGATIVE
Protein, ur: NEGATIVE mg/dL
Specific Gravity, Urine: 1.015 (ref 1.005–1.030)
pH: 6 (ref 5.0–8.0)

## 2023-05-19 LAB — SALICYLATE LEVEL: Salicylate Lvl: <7 mg/dL — ABNORMAL LOW (ref 7.0–30.0)

## 2023-05-19 LAB — ACETAMINOPHEN LEVEL: Acetaminophen (Tylenol), Serum: <10 ug/mL — ABNORMAL LOW (ref 10–30)

## 2023-05-19 MED ORDER — PREDNISONE 20 MG PO TABS: 40.0000 mg | ORAL_TABLET | Freq: Once | ORAL | Status: DC

## 2023-05-19 MED ORDER — LORAZEPAM 1 MG PO TABS
1.0000 mg | ORAL_TABLET | Freq: Once | ORAL | Status: AC
  Administered 2023-05-19: 1 mg via ORAL
  Filled 2023-05-19: qty 1

## 2023-05-19 MED ORDER — DIPHENHYDRAMINE HCL 25 MG PO CAPS
25.0000 mg | ORAL_CAPSULE | Freq: Once | ORAL | Status: AC
  Administered 2023-05-19: 25 mg via ORAL
  Filled 2023-05-19: qty 1

## 2023-05-19 NOTE — ED Notes (Signed)
Called Staffing regarding sitter availability . Will call CN back
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
## 2023-05-19 NOTE — ED Notes (Addendum)
Pt felt the urge to pee, but no urine produced in bathroom. I scanned bladder as he felt that urine was in bladder, but nothing was shown on bladder scanner.

## 2023-05-19 NOTE — ED Triage Notes (Signed)
Patient presents to ED via POV from home with mother. Here with SI. When asked if he had a plan patient states "a gun". Denies HI. Mother reports recent admission to [facility] where multiple medications were changed/adjusted. Mother reports since discharge patient has been very anxious. Hyperactive in triage. Unable to sit still.

## 2023-05-19 NOTE — ED Notes (Signed)
Pulled red, dark green x2, light green x2 and lav x2, all sent to lab.

## 2023-05-19 NOTE — ED Provider Notes (Signed)
[facility] Provider Note   CSN: [id number] Arrival date & time: 05/19/23  1519     History  Chief Complaint  Patient presents with   Psychiatric Evaluation   Anxiety    Jacob [name] is a 27 y.o. male with medical history of acute psychosis.  The patient presents to the ED for evaluation.  Patient presents to the ED with his mother provide some history.  Per patient mother, the patient was recently discharged from [facility] on Monday, 2 days ago.  The patient mother reports that since being discharged the patient has been seemingly acting appropriate at home.  Patient mother reports that today the patient messaged her and stated he "did not feel right" and requested his mom come home from work.  The patient mother reports that she came home and the patient was restless.  Patient reportedly told mother he "was not feeling like himself" which prompted patient's mother to bring him in for evaluation.  The patient was involuntarily committed from 6/23 to 6/25 because of psychosis.  Psychiatry team felt patient was appropriate for inpatient stabilization so he was sent to [facility].  While at [facility] the patient was placed on Depakote as well as Gean Birchwood.  Per patient mother, he has been compliant on his Depakote ever since being discharged.  Patient mother is on to state that the patient is been having issues sleeping ever since being seen at [facility] and being discharged today he was recently placed on trazodone.  Patient reports that he woke up this morning went to the gym.  He states that he then came home and smoked 2 bowls and then began feeling "not like himself".  He reports that he feels very anxious and paranoid.  He states that he feels as if he cannot feel comfortable or get comfortable and he is continuously pacing.  He denies any physical complaints such as chest pain, abdominal pain, nausea, vomiting,  fevers, one-sided weakness or numbness, lightheadedness, dizziness or weakness.  He is reporting a rash to his trunk as well as bilateral upper extremities that began sometime during his stay at [facility].  He believes it is secondary to the medications he is currently on. He is reporting shortness of breath that began 30 minutes prior to arrival.  He reports that he is hearing voices but states that they are only saying gibberish.  He goes on to state that he does have suicidal ideation but denies homicidal ideation.  He reports that he wishes to harm himself utilizing a gun but unable to give any further details regarding his plan.  He is alert and oriented x 4.  HPI     Home Medications Prior to Admission medications   Medication Sig Start Date End Date Taking? Authorizing Provider  ISOtretinoin (CLARAVIS) 40 MG capsule Take 1 capsule (40 mg total) by mouth daily. Patient not taking: Reported on 05/09/2023 03/12/20   Janalyn Harder, MD  QUEtiapine (SEROQUEL XR) 50 MG TB24 24 hr tablet Take 100 mg by mouth at bedtime.    [provider]  Vitamin D, Ergocalciferol, (DRISDOL) 1.25 MG (50000 UNIT) CAPS capsule Take 50,000 Units by mouth every 7 (seven) days.    [provider]      Allergies    Depakote [divalproex sodium] and Latex    Review of Systems   Review of Systems  Skin:  Positive for rash.  Psychiatric/Behavioral:  Positive  for hallucinations and suicidal ideas.   All other systems reviewed and are negative.   Physical Exam Updated Vital Signs BP 136/83 (BP Location: Left Arm)   Pulse 92   Temp 97.7 F (36.5 C)   Resp 15   Ht 6\' 3"  (1.905 m)   Wt 70.3 kg   SpO2 94%   BMI 19.37 kg/m  Physical Exam Vitals and nursing note reviewed.  Constitutional:      General: He is not in acute distress.    Appearance: Normal appearance. He is not ill-appearing, toxic-appearing or diaphoretic.  HENT:     Head: Normocephalic and atraumatic.     Nose: Nose normal.      Mouth/Throat:     Mouth: Mucous membranes are moist.     Pharynx: Oropharynx is clear.  Eyes:     Extraocular Movements: Extraocular movements intact.     Conjunctiva/sclera: Conjunctivae normal.     Pupils: Pupils are equal, round, and reactive to light.  Cardiovascular:     Rate and Rhythm: Normal rate and regular rhythm.  Pulmonary:     Effort: Pulmonary effort is normal.     Breath sounds: Normal breath sounds. No wheezing.  Abdominal:     General: Abdomen is flat. Bowel sounds are normal.     Palpations: Abdomen is soft.     Tenderness: There is no abdominal tenderness.  Musculoskeletal:     Cervical back: Normal range of motion and neck supple. No tenderness.  Skin:    General: Skin is warm and dry.     Capillary Refill: Capillary refill takes less than 2 seconds.  Neurological:     Mental Status: He is alert and oriented to person, place, and time.  Psychiatric:        Mood and Affect: Mood is depressed.        Speech: Speech is delayed.        Behavior: Behavior is slowed. Behavior is cooperative.        Thought Content: Thought content includes suicidal ideation. Thought content includes suicidal plan.     ED Results / Procedures / Treatments   Labs (all labs ordered are listed, but only abnormal results are displayed) Labs Reviewed  BASIC METABOLIC PANEL - Abnormal; Notable for the following components:      Result Value   Glucose, Bld 120 (*)    All other components within normal limits  RAPID URINE DRUG SCREEN, HOSP PERFORMED - Abnormal; Notable for the following components:   Tetrahydrocannabinol POSITIVE (*)    All other components within normal limits  ACETAMINOPHEN LEVEL - Abnormal; Notable for the following components:   Acetaminophen (Tylenol), Serum <10 (*)    All other components within normal limits  SALICYLATE LEVEL - Abnormal; Notable for the following components:   Salicylate Lvl <7.0 (*)    All other components within normal limits  CBC   URINALYSIS, ROUTINE W REFLEX MICROSCOPIC  ETHANOL    EKG EKG Interpretation Date/Time:  Wednesday May 19 2023 17:10:19 EDT Ventricular Rate:  96 PR Interval:  144 QRS Duration:  88 QT Interval:  332 QTC Calculation: 419 R Axis:   86  Text Interpretation: Normal sinus rhythm Right atrial enlargement Borderline ECG When compared with ECG of 09-May-2023 06:50, PREVIOUS ECG IS PRESENT Confirmed by Alvester Chou [phone]) on 05/19/2023 5:13:30 PM  Radiology DG Chest Portable 1 View  Result Date: 05/19/2023 CLINICAL DATA:  Shortness of breath. EXAM: PORTABLE CHEST 1 VIEW COMPARISON:  None Available. FINDINGS:  The heart size and mediastinal contours are within normal limits. Both lungs are clear. The visualized skeletal structures are unremarkable. IMPRESSION: No active disease. Electronically Signed   By: Elgie Collard M.D.   On: 05/19/2023 16:44    Procedures Procedures   Medications Ordered in ED Medications  LORazepam (ATIVAN) tablet 1 mg (1 mg Oral Given 05/19/23 1614)  diphenhydrAMINE (BENADRYL) capsule 25 mg (25 mg Oral Given 05/19/23 1727)    ED Course/ Medical Decision Making/ A&P Clinical Course as of 05/19/23 2129  Wed May 19, 2023  1738 This is a 27 year old male with a history of psychosis and SI and psychiatric hospitalization presenting to the ED with concern for suicidal ideation, plan to shoot himself with a gun.  Patient recently been hospitalized and discharged on psychiatric medications which include Depakote.  He also presents with a rash that is noted for 3 to 4 days, predominantly his bilateral arms and the right side of his chest.  On exam the patient appears comfortable.  He does not demonstrate evidence of active psychosis or responding to auditory hallucinations.  He does have a mildly blanching, erythematous rash noted in the bilateral upper arms, as well as lower chest.  The rash is sparring the face and the soft mucosa of the lips or eyes.  I wonder whether this  may be a drug rash, I do not see evidence of desquamation to suggest that this is developing Stevens-Johnson's or TENS.  I would recommend that we discontinue the Depakote at this time.  We will give him some Benadryl here in the ED.  I would avoid steroids given that the patient has had psychosis in the past.  I do believe the patient is stable medically cleared for TTS evaluation.  IVC was filed given family concerns for suicide risk and potential access to firearms, high risk of self-harm. [MT]    Clinical Course User Index [MT] Trifan, Kermit Balo, MD    Medical Decision Making Amount and/or Complexity of Data Reviewed Labs: ordered.  Risk Prescription drug management.   27 year old male presents to the ED for evaluation with mother.  Please see HPI for further details.  On examination patient alert and oriented x 3.  He is afebrile, tachycardic.  Lung sounds are clear bilaterally and he is not hypoxic.  His abdomen is soft and compressible throughout.  Neurological examination at baseline.  Patient does have mildly blanching erythematous rash noted to bilateral upper arms as well as lower chest and trunk.  CBC without leukocytosis or anemia.  Metabolic panel without electrolyte derangement or elevated anion gap or creatinine.  Salicylate undetectable, acetaminophen undetectable, ethanol undetectable.  Rapid urine drug screen shows positive THC.  Urinalysis unremarkable.  Chest x-ray unremarkable.  EKG nonischemic.  Patient given Ativan as well as Benadryl for rash.  Patient rash decreased at this time.  Patient calmer at this time.  TTS has recommended inpatient psychiatric stabilization for this patient.   Final Clinical Impression(s) / ED Diagnoses Final diagnoses:  Suicidal ideation    Rx / DC Orders ED Discharge Orders     None         Clent Ridges 05/19/23 2129    Terald Sleeper, MD 05/19/23 2330

## 2023-05-19 NOTE — Progress Notes (Signed)
LCSW Progress Note  [id number]   Jacob [name]  05/19/2023  11:11 PM    Inpatient Behavioral Health Placement  Pt meets inpatient criteria per Olin Pia, NP. There are no available beds within [facility] system per [facility] AC Kim Brooks,RN. Referral was sent to the following facilities;   Destination  Service Provider Address Phone [facility]  [address] [place], [place] [address] [phone]  [facility]  [address] [place] [address] [phone]  [facility]  [address] [place] [address] [phone]  [facility] [address] [place]  [address] [place] [address] [phone]  [facility] [place] [address] [phone]  [facility]  [address], [place] [address] [phone]  [facility]  [address] [place] [address] [phone]  [facility]  [address] [place], [place] [address] [phone]  [facility]  [address] [place] [address] [phone]  [facility]  [address] [place] [address] [phone]  [facility]  [address], [place] [address] [phone]  [facility]  [address], [place] [address] [phone]  [facility]  [address] [place] [address] [phone]  [facility]  [address], [place] [address] [phone]  [facility]  [address], [place] [address] [phone]  [facility]  [address], [place] [address] [phone]  [facility]  [address] [place] [address] [phone]  [facility]  [address], [place] [address] [phone]  [facility]  [address] [place] [address] [phone]  [facility]  [address], [place] [address] [phone]  [facility]  [address] [place] [address] [phone]  [facility]  [address] [place] [address] [phone]    Situation ongoing,  CSW will follow up.    Maryjean Ka, MSW, LCSWA 05/19/2023 11:11 PM

## 2023-05-19 NOTE — Progress Notes (Signed)
Pt denied by [facility] due to no covered behavioral health days. CSW/ Disposition team will continue to seek impatent behavioral health placement   Maryjean Ka, MSW, [facility] 05/19/2023 11:20 PM

## 2023-05-19 NOTE — ED Notes (Signed)
Received call from Staffing, no available sitter for 7p-7a

## 2023-05-19 NOTE — ED Notes (Signed)
Pt. Is on with TTS via computer at this time.

## 2023-05-19 NOTE — ED Notes (Signed)
Finished with TTS, still feels sleepy, but better overall.

## 2023-05-19 NOTE — ED Notes (Signed)
Processed IVC thru [employer] Envelope [id number]

## 2023-05-19 NOTE — ED Notes (Signed)
Pt is currently sleeping, will get vitals with temp when awake.

## 2023-05-19 NOTE — ED Notes (Signed)
Update mother [phone] with any details as updates come through for patient.

## 2023-05-19 NOTE — ED Notes (Signed)
Spoke with TTS who will look for placement for the Pt.

## 2023-05-20 ENCOUNTER — Encounter (HOSPITAL_COMMUNITY): Payer: Self-pay | Admitting: Family

## 2023-05-20 ENCOUNTER — Inpatient Hospital Stay (HOSPITAL_COMMUNITY)
Admission: AD | Admit: 2023-05-20 | Discharge: 2023-05-26 | DRG: 885 | Disposition: A | Discharge status: Home / Self Care | Payer: Commercial Managed Care - PPO | Source: Intra-hospital | Attending: Psychiatry | Admitting: Psychiatry

## 2023-05-20 ENCOUNTER — Other Ambulatory Visit: Payer: Self-pay

## 2023-05-20 DIAGNOSIS — I10 Essential (primary) hypertension: Secondary | ICD-10-CM | POA: Diagnosis present

## 2023-05-20 DIAGNOSIS — R45851 Suicidal ideations: Secondary | ICD-10-CM | POA: Diagnosis present

## 2023-05-20 DIAGNOSIS — Z79899 Other long term (current) drug therapy: Secondary | ICD-10-CM | POA: Diagnosis not present

## 2023-05-20 DIAGNOSIS — F333 Major depressive disorder, recurrent, severe with psychotic symptoms: Principal | ICD-10-CM | POA: Diagnosis present

## 2023-05-20 DIAGNOSIS — F4010 Social phobia, unspecified: Secondary | ICD-10-CM | POA: Diagnosis present

## 2023-05-20 DIAGNOSIS — G4700 Insomnia, unspecified: Secondary | ICD-10-CM | POA: Diagnosis present

## 2023-05-20 DIAGNOSIS — F332 Major depressive disorder, recurrent severe without psychotic features: Secondary | ICD-10-CM | POA: Diagnosis present

## 2023-05-20 DIAGNOSIS — Z20822 Contact with and (suspected) exposure to covid-19: Secondary | ICD-10-CM | POA: Diagnosis present

## 2023-05-20 DIAGNOSIS — F411 Generalized anxiety disorder: Secondary | ICD-10-CM | POA: Diagnosis present

## 2023-05-20 DIAGNOSIS — F23 Brief psychotic disorder: Secondary | ICD-10-CM | POA: Diagnosis present

## 2023-05-20 DIAGNOSIS — K5900 Constipation, unspecified: Secondary | ICD-10-CM | POA: Diagnosis present

## 2023-05-20 DIAGNOSIS — F410 Panic disorder [episodic paroxysmal anxiety] without agoraphobia: Secondary | ICD-10-CM | POA: Diagnosis present

## 2023-05-20 DIAGNOSIS — Z560 Unemployment, unspecified: Secondary | ICD-10-CM | POA: Diagnosis not present

## 2023-05-20 LAB — SARS CORONAVIRUS 2 BY RT PCR: SARS Coronavirus 2 by RT PCR: NEGATIVE

## 2023-05-20 MED ORDER — LORAZEPAM 1 MG PO TABS
2.0000 mg | ORAL_TABLET | Freq: Three times a day (TID) | ORAL | Status: DC | PRN
  Administered 2023-05-20 – 2023-05-23 (×2): 2 mg via ORAL
  Filled 2023-05-20 (×2): qty 2

## 2023-05-20 MED ORDER — DIPHENHYDRAMINE HCL 25 MG PO CAPS
50.0000 mg | ORAL_CAPSULE | Freq: Three times a day (TID) | ORAL | Status: DC | PRN
  Administered 2023-05-20 – 2023-05-23 (×2): 50 mg via ORAL
  Filled 2023-05-20 (×2): qty 2

## 2023-05-20 MED ORDER — ACETAMINOPHEN 325 MG PO TABS: 650.0000 mg | ORAL_TABLET | Freq: Four times a day (QID) | ORAL | Status: DC | PRN

## 2023-05-20 MED ORDER — NICOTINE 21 MG/24HR TD PT24
21.0000 mg | MEDICATED_PATCH | Freq: Every day | TRANSDERMAL | Status: DC
  Administered 2023-05-20 – 2023-05-26 (×7): 21 mg via TRANSDERMAL
  Filled 2023-05-20 (×8): qty 1

## 2023-05-20 MED ORDER — LORAZEPAM 1 MG PO TABS
1.0000 mg | ORAL_TABLET | Freq: Once | ORAL | Status: AC
  Administered 2023-05-20: 1 mg via ORAL
  Filled 2023-05-20: qty 1

## 2023-05-20 MED ORDER — HALOPERIDOL LACTATE 5 MG/ML IJ SOLN
5.0000 mg | Freq: Three times a day (TID) | INTRAMUSCULAR | Status: DC | PRN
  Administered 2023-05-20: 5 mg via INTRAMUSCULAR

## 2023-05-20 MED ORDER — HYDROXYZINE HCL 25 MG PO TABS
25.0000 mg | ORAL_TABLET | Freq: Four times a day (QID) | ORAL | Status: DC | PRN
  Administered 2023-05-20 (×2): 25 mg via ORAL
  Filled 2023-05-20 (×2): qty 1

## 2023-05-20 MED ORDER — DIPHENHYDRAMINE HCL 50 MG/ML IJ SOLN
50.0000 mg | Freq: Three times a day (TID) | INTRAMUSCULAR | Status: DC | PRN
  Administered 2023-05-20: 50 mg via INTRAMUSCULAR

## 2023-05-20 MED ORDER — TRAZODONE HCL 50 MG PO TABS
50.0000 mg | ORAL_TABLET | Freq: Every evening | ORAL | Status: DC | PRN
  Administered 2023-05-21 – 2023-05-25 (×5): 50 mg via ORAL
  Filled 2023-05-20 (×5): qty 1

## 2023-05-20 MED ORDER — MAGNESIUM HYDROXIDE 400 MG/5ML PO SUSP: 30.0000 mL | Freq: Every day | ORAL | Status: DC | PRN

## 2023-05-20 MED ORDER — LORAZEPAM 1 MG PO TABS
1.0000 mg | ORAL_TABLET | ORAL | Status: DC | PRN
  Administered 2023-05-20: 1 mg via ORAL
  Filled 2023-05-20: qty 1

## 2023-05-20 MED ORDER — QUETIAPINE FUMARATE ER 50 MG PO TB24
100.0000 mg | ORAL_TABLET | Freq: Every day | ORAL | Status: DC
  Administered 2023-05-20 – 2023-05-23 (×4): 100 mg via ORAL
  Filled 2023-05-20 (×7): qty 2

## 2023-05-20 MED ORDER — LORAZEPAM 2 MG/ML IJ SOLN
2.0000 mg | Freq: Three times a day (TID) | INTRAMUSCULAR | Status: DC | PRN
  Administered 2023-05-20: 2 mg via INTRAMUSCULAR

## 2023-05-20 MED ORDER — HALOPERIDOL 5 MG PO TABS
5.0000 mg | ORAL_TABLET | Freq: Three times a day (TID) | ORAL | Status: DC | PRN
  Administered 2023-05-20 – 2023-05-23 (×2): 5 mg via ORAL
  Filled 2023-05-20 (×2): qty 1

## 2023-05-20 MED ORDER — HALOPERIDOL LACTATE 5 MG/ML IJ SOLN
INTRAMUSCULAR | Status: AC
  Filled 2023-05-20: qty 2

## 2023-05-20 MED ORDER — DIPHENHYDRAMINE HCL 50 MG/ML IJ SOLN
INTRAMUSCULAR | Status: AC
  Filled 2023-05-20: qty 1

## 2023-05-20 MED ORDER — ALUM & MAG HYDROXIDE-SIMETH 200-200-20 MG/5ML PO SUSP: 30.0000 mL | ORAL | Status: DC | PRN

## 2023-05-20 MED ORDER — OLANZAPINE 5 MG PO TBDP
5.0000 mg | ORAL_TABLET | Freq: Once | ORAL | Status: AC
  Administered 2023-05-20: 5 mg via ORAL
  Filled 2023-05-20: qty 1

## 2023-05-20 MED ORDER — HYDROXYZINE HCL 25 MG PO TABS
25.0000 mg | ORAL_TABLET | Freq: Three times a day (TID) | ORAL | Status: DC | PRN
  Administered 2023-05-21 – 2023-05-25 (×10): 25 mg via ORAL
  Filled 2023-05-20 (×10): qty 1

## 2023-05-20 MED ORDER — NICOTINE 21 MG/24HR TD PT24
MEDICATED_PATCH | TRANSDERMAL | Status: AC
  Filled 2023-05-20: qty 1

## 2023-05-20 MED ORDER — LORAZEPAM 2 MG/ML IJ SOLN
INTRAMUSCULAR | Status: AC
  Filled 2023-05-20: qty 1

## 2023-05-20 NOTE — ED Notes (Signed)
Patient provided a phone and reminded of 2 5 min phone call policy. Patient stated he is not going if they sent him somewhere he does not want to go. Informed patient is under IVC and safety is a priority. Sitter and security at bedside.

## 2023-05-20 NOTE — ED Notes (Signed)
Pt ambulated with sitter to restroom.  Voiding without difficulty

## 2023-05-20 NOTE — Progress Notes (Signed)
   05/20/23 1950  Psych Admission Type (Psych Patients Only)  Admission Status Involuntary  Psychosocial Assessment  Patient Complaints Agitation;Anger;Crying spells;Self-harm thoughts  Eye Contact Brief  Facial Expression Angry  Affect Depressed;Angry  Speech Argumentative;Loud  Interaction Demanding  Motor Activity Restless  Appearance/Hygiene In scrubs;Poor hygiene  Behavior Characteristics Agitated;Agressive verbally  Mood Angry  Thought Process  Coherency WDL  Content WDL  Delusions None reported or observed  Perception Hallucinations  Hallucination None reported or observed  Judgment Poor  Confusion WDL  Danger to Self  Current suicidal ideation? Denies  Danger to Others  Danger to Others Reported or observed  Danger to Others Abnormal  Harmful Behavior to others Threats of violence towards other people observed or expressed   Destructive Behavior Acts of violence toward property observed   Description of Harmful Behavior Patient reported, "HI towards anyone who tries to stop him from leaving."  Description of Destructive Behavior Patient banging against bed and window

## 2023-05-20 NOTE — Progress Notes (Addendum)
ADDENDUM  Patient was deferred for review by [facility], due to declining previous accepted placement at [facility]. PT HAS BEEN ACCEPTED TO [facility].   Pt was accepted to [facility] TODAY 05/20/2023, pending negative covid faxed to [phone]. Bed assignment: Adult unit  Pt meets inpatient criteria per Doran Heater, NP  Attending Physician will be Luberta Mutter, MD  Report can be called to: [phone]  Pt can arrive after pending items are received  Care Team Notified: Doran Heater, NP, Staci Acosta, LCSWA, and [facility], RN  [place], [place]  05/20/2023 8:36 AM

## 2023-05-20 NOTE — Progress Notes (Signed)
Pt was accepted to [facility] TODAY 05/20/2023. Bed assignment: 500-1  Pt meets inpatient criteria per Doran Heater, NP  Attending Physician will be Phineas Inches, MD  Report can be called to: - Adult unit: [phone]  Pt can arrive after pending items are received  Care Team Notified: [facility] Rona Ravens, RN, Doran Heater, NP, Loretha Stapler, RN, Alvira Monday, MD, Bryna Colander, RN, and Cleatrice Burke, RN  [place], LCSW  05/20/2023 10:46 AM

## 2023-05-20 NOTE — ED Notes (Signed)
Called [place] police dept for transport at 11:12

## 2023-05-20 NOTE — Group Note (Signed)
Date:  05/20/2023 Time:  9:46 PM  Group Topic/Focus:  Wrap-Up Group:   The focus of this group is to help patients review their daily goal of treatment and discuss progress on daily workbooks.    Participation Level:  Did Not Attend  Participation Quality:  None  Affect:   None  Cognitive:   None  Insight: None  Engagement in Group:  None  Modes of Intervention:   None  Additional Comments:  None  Tacy Dura 05/20/2023, 9:46 PM

## 2023-05-20 NOTE — Group Note (Deleted)
Date:  05/20/2023 Time:  9:17 PM  Group Topic/Focus:  Wrap-Up Group:   The focus of this group is to help patients review their daily goal of treatment and discuss progress on daily workbooks.     Participation Level:  {BHH PARTICIPATION NGEXB:28413}  Participation Quality:  {BHH PARTICIPATION QUALITY:22265}  Affect:  {BHH AFFECT:22266}  Cognitive:  {BHH COGNITIVE:22267}  Insight: {BHH Insight2:20797}  Engagement in Group:  {BHH ENGAGEMENT IN KGMWN:02725}  Modes of Intervention:  {BHH MODES OF INTERVENTION:22269}  Additional Comments:  ***  Jacob [name] 05/20/2023, 9:17 PM

## 2023-05-20 NOTE — ED Notes (Signed)
[phone]; patient's parent's updated via phone in regards to patient's disposition and current state. Patient's parents voiced desire to stay within [facility], attempted to explain that for acute behavioral needs, that [facility] is the facility assigned at the moment.

## 2023-05-20 NOTE — ED Notes (Signed)
Pt reports difficulty sleeping. Dr Jacqulyn Bath aware and orders placed

## 2023-05-20 NOTE — Plan of Care (Signed)
  Problem: Education: Goal: Emotional status will improve Outcome: Not Progressing Goal: Mental status will improve Outcome: Not Progressing   

## 2023-05-20 NOTE — BHH Group Notes (Deleted)
The focus of this group is to help patients review their daily goal of treatment and discuss progress on daily workbooks.   

## 2023-05-20 NOTE — Progress Notes (Signed)
Received a call from [facility] informing that [facility] has a bed available for this patient. The telephone number given was [phone] .

## 2023-05-20 NOTE — Progress Notes (Signed)
Admission Note: Patient is a 27 year old male admitted involuntarily to the unit [facility] [place] for symptoms of substance-induced psychosis, passive SI and aggression.  Per IVC: Patient endorsing suicidal ideation, hearing voices, panic attack after smoking a THC from a bowl.  Patient presents with irritable mood and affect.  Refused to participate with admission process.  Threatening to elope and demanding to leave the hospital.  Refused to sign consent for treatment.  Skin assessment completed.  Skin is dry and intact.  No contraband found.  Patient was oriented to the unit, staff and room.  Patient observed slamming the phone, door and banging on the unit.  Refused to follow verbal redirection from staff but continues with his aggressive behaviors.  Ativan 2 mg, Benadryl 50 mg and Haldol 5 mg IM given for severe agitation/safety concern.  Routine safety checks maintained.

## 2023-05-20 NOTE — Progress Notes (Addendum)
  Agitation Protocol Nursing Note:  At approximately 0720 patient was concluding a visit with family and became very irate, shouting and demanding to get his clothes now that had just been brought in by his visitor.  Other RN and staff intervened to explain our process for bringing items onto the unit.  Patient remained uncooperative continuing to shout at staff, in his room banging, and threatening to leave.  Writer approached patient and guided patient to his room.  Patient stated, "I don't want to be here, I'm not supposed to be here."  Patient reported, "I was just at [facility]  Patient started shouting that he's going to break out of here, I'd rather be in jail than here."  Writer attempted to explain that an assessment must first take place and that will happen tomorrow.  Writer advised that leaving tonight is non-negotiable.  Patient shouted for writer to just get out of his room.  Patient continued shouting and banging in his room.  Writer went to medication room.  Administered PRN Benadryl, Haldol, and Ativan per MAR in response to patient agitation.

## 2023-05-20 NOTE — Progress Notes (Signed)
Patient ID: Tyre Coady, male   DOB: [birth year], 27 y.o.   MRN: [id number]   Initial Treatment Plan 05/20/2023 1:56 PM Scot Ensor [id number]    PATIENT STRESSORS: Marital or family conflict   Medication change or noncompliance   Substance abuse     PATIENT STRENGTHS: Supportive family/friends    PATIENT IDENTIFIED PROBLEMS: Substance Use  Psychosis  Suicidal ideation  Audio Hallucinations  Anxiety  Anger           DISCHARGE CRITERIA:  Improved stabilization in mood, thinking, and/or behavior Reduction of life-threatening or endangering symptoms to within safe limits Verbal commitment to aftercare and medication compliance Withdrawal symptoms are absent or subacute and managed without 24-hour nursing intervention  PRELIMINARY DISCHARGE PLAN: Attend 12-step recovery group Outpatient therapy Participate in family therapy Return to previous living arrangement Return to previous work or school arrangements  PATIENT/FAMILY INVOLVEMENT: This treatment plan has been presented to and reviewed with the patient, Ranald Batz, and/or family member.  The patient and family have been given the opportunity to ask questions and make suggestions.  Tania Ade, RN 05/20/2023, 1:56 PM

## 2023-05-20 NOTE — ED Notes (Signed)
Pt woke up and complains that his bed and shirt are wet. Pt found to be sweating and linens and top changed. Pt denies any other needs at this time. Sitter remains at bedside. Vitals reassassed

## 2023-05-20 NOTE — Progress Notes (Signed)
8:21 AM - CSW spoke with Donnal Debar, intake coordinator, at [facility] regarding pt's bed offer. Randi reports she will discuss referral with provider and update this CSW with admission decision. CSW will await follow up.  Cathie Beams, [place]  05/20/2023 8:26 AM

## 2023-05-20 NOTE — ED Notes (Signed)
Attempted to get clarification for bed placement, informed Loray [phone] of attempt to give report to [facility] that they need a referral for the patient. States they will forward info to SW. Callback number provided.

## 2023-05-20 NOTE — ED Notes (Signed)
Report called to recieiving nurse, Venda Rodes at [facility]. Awaiting law enforcement for transfer

## 2023-05-20 NOTE — ED Notes (Signed)
Family updated as to patient's status, spoke with Mother after obtaining verbal consent to notify og transfer to [facility], room 500-1.

## 2023-05-21 DIAGNOSIS — F333 Major depressive disorder, recurrent, severe with psychotic symptoms: Principal | ICD-10-CM | POA: Insufficient documentation

## 2023-05-21 MED ORDER — HYDROCORTISONE 1 % EX CREA
TOPICAL_CREAM | Freq: Two times a day (BID) | CUTANEOUS | Status: DC
  Administered 2023-05-21 – 2023-05-22 (×3): 1 via TOPICAL
  Filled 2023-05-21 (×2): qty 28

## 2023-05-21 NOTE — H&P (Signed)
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   MDD (major depressive disorder), recurrent episode, severe (HCC)  I certify that inpatient services furnished can reasonably be expected to improve the patient's condition.    Cecilie Lowers, FNP 7/5/20243:23 PM

## 2023-05-21 NOTE — Progress Notes (Signed)
Did not attend group 
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
## 2023-05-21 NOTE — Group Note (Signed)
Date:  05/21/2023 Time:  9:10 PM  Group Topic/Focus:  Wrap-Up Group:   The focus of this group is to help patients review their daily goal of treatment and discuss progress on daily workbooks.    Participation Level:  Active  Participation Quality:  Appropriate and Attentive  Affect:  Appropriate  Cognitive:  Appropriate  Insight: Good  Engagement in Group:  Improving  Modes of Intervention:  Discussion  Additional Comments:  Pt said he had a 4 out of 10 rating on his day. His goal is to figure out how to leave the facility.  Valetta Mulroy E Sloka Volante 05/21/2023, 9:10 PM

## 2023-05-21 NOTE — Group Note (Signed)
Recreation Therapy Group Note   Group Topic:Team Building  Group Date: 05/21/2023 Start Time: 1025 End Time: 1040 Facilitators: Kacper Cartlidge-McCall, LRT,CTRS Location: [address] Dayroom   Goal Area(s) Addresses:  Patient will effectively work with peer towards shared goal.  Patient will identify skills used to make activity successful.  Patient will identify how skills used during activity can be used to reach post d/c goals.   Group Description: Straw Bridge. In teams of 3-5, patients were given 15 plastic drinking straws and an equal length of masking tape. Using the materials provided, patients were instructed to build a free standing bridge-like structure to suspend an everyday item (ex: puzzle box) off of the floor or table surface. All materials were required to be used by the team in their design. LRT facilitated post-activity discussion reviewing team process. Patients were encouraged to reflect how the skills used in this activity can be generalized to daily life post discharge.     Affect/Mood: N/A   Participation Level: Did not attend    Clinical Observations/Individualized Feedback:     Plan: Continue to engage patient in RT group sessions 2-3x/week.   Seith Aikey-McCall, LRT,CTRS 05/21/2023 1:17 PM

## 2023-05-21 NOTE — BHH Group Notes (Signed)
Spiritual care group facilitated by Chaplain Katy Kampbell Holaway, BCC  Group focused on topic of strength. Group members reflected on what thoughts and feelings emerge when they hear this topic. They then engaged in facilitated dialog around how strength is present in their lives. This dialog focused on representing what strength had been to them in their lives (images and patterns given) and what they saw as helpful in their life now (what they needed / wanted).  Activity drew on narrative framework.  Patient Progress: Did not attend.  

## 2023-05-21 NOTE — Progress Notes (Signed)
Recreation Therapy Notes  INPATIENT RECREATION THERAPY ASSESSMENT  Patient Details Name: Reda Manzoor MRN: [id number] DOB: 1995/12/06 Today's Date: 05/21/2023       Information Obtained From: Patient  Able to Participate in Assessment/Interview: Yes  Patient Presentation:  (Drowsy)  Reason for Admission (Per Patient): Other (Comments) ("anxiety, panic attacks")  Patient Stressors:  (None)  Coping Skills:   Music, Substance Abuse, Talk  Leisure Interests (2+):  Sports - Basketball, Sports - Other (Comment), Individual - Computer (Riding dirt bike)  Frequency of Recreation/Participation: Other (Comment) (Basketball-Once in a blue moon; Computer- Daily; Dirk bikes- Weekends)  Biochemist, clinical Resources:  Yes  Community Resources:  Other (Comment) ("Everything")  Current Use: Yes  If no, Barriers?:    Expressed Interest in [employer] Information: No  County of Residence:  [place]  Patient Main Form of Transportation: Car  Patient Strengths:  Popping wheeles; Dunking  Patient Identified Areas of Improvement:  None  Patient Goal for Hospitalization:  "get out"  Current SI (including self-harm):  No  Current HI:  No  Current AVH: No  Staff Intervention Plan: Group Attendance, Collaborate with Interdisciplinary Treatment Team  Consent to Intern Participation: N/A   Seanne Chirico-McCall, LRT,CTRS Itzy Adler A Aylin Rhoads-McCall 05/21/2023, 1:57 PM

## 2023-05-21 NOTE — Progress Notes (Signed)
Dar Note: Patient presents with anxious affect and irritable mood.  Denies suicidal thoughts, auditory and visual hallucinations.  Patient preoccupied with getting discharge.  Vistaril 25 mg given x 2 for symptoms of anxiety with good effect.  Hydrocortisone cream applied to rash on back, legs and back. Routine safety checks maintained.  Patient offered support and encouragement to follow treatment plan of care.  Patient is safe on the unit.

## 2023-05-21 NOTE — Group Note (Deleted)
Date:  05/21/2023 Time:  1:46 PM  Group Topic/Focus:  Goals Group:   The focus of this group is to help patients establish daily goals to achieve during treatment and discuss how the patient can incorporate goal setting into their daily lives to aide in recovery. Orientation:   The focus of this group is to educate the patient on the purpose and policies of crisis stabilization and provide a format to answer questions about their admission.  The group details unit policies and expectations of patients while admitted.     Participation Level:  {BHH PARTICIPATION ZOXWR:60454}  Participation Quality:  {BHH PARTICIPATION QUALITY:22265}  Affect:  {BHH AFFECT:22266}  Cognitive:  {BHH COGNITIVE:22267}  Insight: {BHH Insight2:20797}  Engagement in Group:  {BHH ENGAGEMENT IN GROUP:22268}  Modes of Intervention:  {BHH MODES OF INTERVENTION:22269}  Additional Comments:  ***  Jacob [name] 05/21/2023, 1:46 PM

## 2023-05-21 NOTE — BHH Suicide Risk Assessment (Signed)
Suicide Risk Assessment  Admission Assessment    [facility] Admission Suicide Risk Assessment   Nursing information obtained from:  Patient Demographic factors:  Male Current Mental Status:  Self-harm thoughts Loss Factors:  NA Historical Factors:  NA Risk Reduction Factors:  Living with another person, especially a relative  Total Time spent with patient: 30 minutes Principal Problem: MDD (major depressive disorder), recurrent episode, severe (HCC) Diagnosis:  Principal Problem:   MDD (major depressive disorder), recurrent episode, severe (HCC)  Subjective Data: Jacob [name] is a 27 year old Caucasian male with prior psychiatric history significant for MDD, generalized anxiety, and psychosis who presents involuntarily to [facility] from [facility] health emergency department at [facility] after stabilization for acute psychosis in the context of smoking 2 bowls of marijuana.   Continued Clinical Symptoms:    The "Alcohol Use Disorders Identification Test", Guidelines for Use in Primary Care, Second Edition.  World Science writer [facility]). Score between 0-7:  no or low risk or alcohol related problems. Score between 8-15:  moderate risk of alcohol related problems. Score between 16-19:  high risk of alcohol related problems. Score 20 or above:  warrants further diagnostic evaluation for alcohol dependence and treatment.  CLINICAL FACTORS:   Severe Anxiety and/or Agitation Depression:   Delusional Hopelessness Impulsivity Insomnia Severe Alcohol/Substance Abuse/Dependencies More than one psychiatric diagnosis Previous Psychiatric Diagnoses and Treatments  Musculoskeletal: Strength & Muscle Tone: within normal limits Gait & Station: normal Patient leans: N/A  Psychiatric Specialty Exam:  Presentation  General Appearance:  Disheveled  Eye Contact: Fair  Speech: Clear and Coherent  Speech Volume: Normal  Handedness: Right  Mood  and Affect  Mood: Anxious; Depressed  Affect: Congruent  Thought Process  Thought Processes: Disorganized  Descriptions of Associations:Loose  Orientation:Full (Time, Place and Person)  Thought Content:Delusions  History of Schizophrenia/Schizoaffective disorder:No  Duration of Psychotic Symptoms:Less than six months  Hallucinations:Hallucinations: None Description of Auditory Hallucinations: Denies auditory hallucination  Ideas of Reference:Paranoia; Delusions  Suicidal Thoughts:Suicidal Thoughts: No SI Active Intent and/or Plan: -- (Denies)  Homicidal Thoughts:Homicidal Thoughts: No HI Active Intent and/or Plan: -- (Denies)   Sensorium  Memory: Immediate Fair; Recent Fair  Judgment: Poor  Insight: Poor  Executive Functions  Concentration: Poor  Attention Span: Fair  Recall: [employer] of Knowledge: Fair  Language: Fair  Psychomotor Activity  Psychomotor Activity: Psychomotor Activity: Restlessness; Mannerisms  Assets  Assets: Housing; Physical Health; Social Support  Sleep  Sleep: Sleep: Good Number of Hours of Sleep: 8.6  Physical Exam: Physical Exam Vitals and nursing note reviewed.  HENT:     Head: Normocephalic.     Nose: Nose normal.     Mouth/Throat:     Mouth: Mucous membranes are moist.     Pharynx: Oropharynx is clear.  Eyes:     Extraocular Movements: Extraocular movements intact.     Pupils: Pupils are equal, round, and reactive to light.  Cardiovascular:     Rate and Rhythm: Normal rate.     Pulses: Normal pulses.  Abdominal:     Comments: Deferred  Genitourinary:    Comments: Deferred Musculoskeletal:        General: Normal range of motion.  Skin:    General: Skin is warm.  Neurological:     General: No focal deficit present.     Mental Status: He is alert and oriented to person, place, and time.  Psychiatric:     Comments: Patient is restless and preoccupied on  being discharged.    Review of Systems   Constitutional:  Negative for chills and fever.  HENT:  Negative for sore throat.   Eyes: Negative.   Respiratory:  Negative for cough, shortness of breath and wheezing.   Cardiovascular:  Negative for chest pain and palpitations.  Gastrointestinal:  Negative for abdominal pain, heartburn, nausea and vomiting.  Genitourinary: Negative.   Musculoskeletal: Negative.   Skin:  Negative for itching.  Neurological:  Negative for dizziness, tingling, tremors, sensory change, speech change, focal weakness, seizures, loss of consciousness, weakness and headaches.  Endo/Heme/Allergies:        See allergy listing  Psychiatric/Behavioral:  Positive for depression and substance abuse. The patient is nervous/anxious and has insomnia.    Blood pressure 119/87, pulse 90, temperature (!) 97.4 F (36.3 C), temperature source Oral, resp. rate 20, height 6\' 2"  (1.88 m), SpO2 100 %. Body mass index is 19.9 kg/m.  COGNITIVE FEATURES THAT CONTRIBUTE TO RISK:  Polarized thinking    SUICIDE RISK:   Severe:  Frequent, intense, and enduring suicidal ideation, specific plan, no subjective intent, but some objective markers of intent (i.e., choice of lethal method), the method is accessible, some limited preparatory behavior, evidence of impaired self-control, severe dysphoria/symptomatology, multiple risk factors present, and few if any protective factors, particularly a lack of social support.  PLAN OF CARE: Treatment Plan Summary: Daily contact with patient to assess and evaluate symptoms and progress in treatment and Medication management  Physician Treatment Plan for Primary Diagnosis:  Assessment: MDD (major depressive disorder), recurrent episode, severe (HCC) GAD Substance-induced acute psychosis  Plan: Medication: Continue Seroquel XR 24-hour tablet 100 mg p.o. nightly for mood stabilization Continue trazodone tablet 50 mg p.o. nightly as needed for insomnia Continue hydroxyzine tablet 25 mg p.o.  3 times daily as needed for anxiety Nicotine dose in mg per 24 hours 21 mg patch transdermal over 24 hours daily for smoking cessation  Agitation protocol: Benadryl capsule 50 mg p.o. or IM 3 times daily as needed agitation   Haldol tablets 5 mg po IM 3 times daily as needed agitation   Lorazepam tablet 2 mg p.o. or IM 3 times daily as needed agitation    Other PRN Medications -Acetaminophen 650 mg every 6 as needed/mild pain -Maalox 30 mL oral every 4 as needed/digestion -Magnesium hydroxide 30 mL daily as needed/mild constipation  -- The risks/benefits/side-effects/alternatives to this medication were discussed in detail with the patient and time was given for questions. The patient consents to medication trial.              -- Encouraged patient to participate  in unit milieu and in scheduled group therapies    EKG: NSR, ventricular rate 96, QT/QTc 332/419  Safety and Monitoring: Voluntary admission to inpatient psychiatric unit for safety, stabilization and treatment Daily contact with patient to assess and evaluate symptoms and progress in treatment Patient's case to be discussed in multi-disciplinary team meeting Observation Level : q15 minute checks Vital signs: q12 hours Precautions: suicide, but pt currently verbally contracts for safety on unit    Discharge Planning: Social work and case management to assist with discharge planning and identification of hospital follow-up needs prior to discharge Estimated LOS: 5-7 days Discharge Concerns: Need to establish a safety plan; Medication compliance and effectiveness Discharge Goals: Return home with outpatient referrals for mental health follow-up including medication management/psychotherapy.   Long Term Goal(s): Improvement in symptoms so as ready for discharge  Short Term Goals: Ability to  identify changes in lifestyle to reduce recurrence of condition will improve, Ability to verbalize feelings will improve, Ability to  disclose and discuss suicidal ideas, Ability to demonstrate self-control will improve, Ability to identify and develop effective coping behaviors will improve, Ability to maintain clinical measurements within normal limits will improve, Compliance with prescribed medications will improve, and Ability to identify triggers associated with substance abuse/mental health issues will improve  Physician Treatment Plan for Secondary Diagnosis: Principal Problem:   MDD (major depressive disorder), recurrent episode, severe (HCC)  I certify that inpatient services furnished can reasonably be expected to improve the patient's condition.   Cecilie Lowers, FNP 05/21/2023, 3:15 PM

## 2023-05-21 NOTE — Progress Notes (Addendum)
   05/21/23 0622  Vital Signs  Temp (!) 97.4 F (36.3 C)  Temp Source Oral  Pulse Rate (!) 123  Pulse Rate Source Monitor  BP (!) 124/90  BP Location Left Arm  BP Method Automatic  Patient Position (if appropriate) Sitting  Oxygen Therapy  SpO2 100 %     05/21/23 0623  Vital Signs  Pulse Rate (!) 150  BP 119/87  BP Location Left Arm  BP Method Automatic  Patient Position (if appropriate) Standing    Patient is up.  Patient appears to be anxious.  Hydrated patient with a cup of Gatorade. Patient is asymptomatic.  At [phone], patient is laying in bed.  Writer took patient's manual pulse.     05/21/23 0635  Vital Signs  Pulse Rate 90  Pulse Rate Source Left;Radial  BP Method Manual  Patient Position (if appropriate) Lying   Patient encouraged to continue hydrating.

## 2023-05-21 NOTE — Progress Notes (Signed)
   05/21/23 0545  15 Minute Checks  Location Bedroom  Visual Appearance Calm  Behavior Sleeping  Sleep (Behavioral Health Patients Only)  Calculate sleep? (Click Yes once per 24 hr at 0600 safety check) Yes  Documented sleep last 24 hours 8.25

## 2023-05-21 NOTE — Group Note (Deleted)
Date:  05/21/2023 Time:  3:31 PM  Group Topic/Focus:  Diagnosis Education:   The focus of this group is to discuss the major disorders that patients maybe diagnosed with.  Group discusses the importance of knowing what one's diagnosis is so that one can understand treatment and better advocate for oneself.     Participation Level:  {BHH PARTICIPATION LEVEL:22264}  Participation Quality:  {BHH PARTICIPATION QUALITY:22265}  Affect:  {BHH AFFECT:22266}  Cognitive:  {BHH COGNITIVE:22267}  Insight: {BHH Insight2:20797}  Engagement in Group:  {BHH ENGAGEMENT IN GROUP:22268}  Modes of Intervention:  {BHH MODES OF INTERVENTION:22269}  Additional Comments:  ***  Lavinia Mcneely W Mak Bonny 05/21/2023, 3:31 PM  

## 2023-05-22 MED ORDER — PROPRANOLOL HCL 10 MG PO TABS
10.0000 mg | ORAL_TABLET | Freq: Two times a day (BID) | ORAL | Status: DC
  Administered 2023-05-22 – 2023-05-26 (×10): 10 mg via ORAL
  Filled 2023-05-22 (×13): qty 1

## 2023-05-22 NOTE — Plan of Care (Signed)
°  Problem: Education: °Goal: Emotional status will improve °Outcome: Progressing °Goal: Mental status will improve °Outcome: Progressing °Goal: Verbalization of understanding the information provided will improve °Outcome: Progressing °  °

## 2023-05-22 NOTE — Progress Notes (Signed)
   05/22/23 0630  15 Minute Checks  Location Hallway  Visual Appearance Calm  Behavior Composed  Sleep (Behavioral Health Patients Only)  Calculate sleep? (Click Yes once per 24 hr at 0600 safety check) Yes  Documented sleep last 24 hours 11

## 2023-05-22 NOTE — Progress Notes (Signed)
   05/22/23 1044  Psych Admission Type (Psych Patients Only)  Admission Status Involuntary  Psychosocial Assessment  Patient Complaints Anxiety  Eye Contact Fair  Facial Expression Anxious  Affect Appropriate to circumstance  Speech Logical/coherent  Interaction Minimal  Motor Activity Other (Comment) (WDL)  Appearance/Hygiene Unremarkable  Behavior Characteristics Anxious;Cooperative  Mood Anxious  Thought Process  Coherency WDL  Content WDL  Delusions None reported or observed  Perception WDL  Hallucination None reported or observed  Judgment Poor  Confusion None  Danger to Self  Current suicidal ideation? Denies  Agreement Not to Harm Self Yes  Description of Agreement Verbal  Danger to Others  Danger to Others None reported or observed

## 2023-05-22 NOTE — Progress Notes (Signed)
[facility] MD Progress Note  05/22/2023 1:23 PM Jacob [name]  MRN:  [id number]  Subjective: Jacob [name] report, " I want to go home, I am not supposed to be here.  I want to be discharged now."  Principal Problem: MDD (major depressive disorder), recurrent, severe, with psychosis (HCC) Diagnosis: Principal Problem:   MDD (major depressive disorder), recurrent, severe, with psychosis (HCC) Active Problems:   MDD (major depressive disorder), recurrent episode, severe (HCC)  Reason for admission:  Jacob [name] is a 27 year old Caucasian male with prior psychiatric history significant for MDD, generalized anxiety, and psychosis who presents involuntarily to [facility] from [facility] health emergency department at [facility] after stabilization for acute psychosis in the context of smoking 2 bowls of marijuana.   Yesterday the psychiatry team made the following recommendations: Continue Seroquel XR 24-hour tablet 100 mg p.o. nightly for mood stabilization Continue trazodone tablet 50 mg p.o. nightly as needed for insomnia Continue hydroxyzine tablet 25 mg p.o. 3 times daily as needed for anxiety Nicotine dose in mg per 24 hours 21 mg patch transdermal over 24 hours daily for smoking cessation Initiate Inderal 10 mg tablet p.o. twice daily for elevated pulse  On assessment today, the pt reports that his mood is less depressed. Reports that anxiety is at manageable level Staff report patient sleeping over 11 hours last night.   Appetite is good, observed going to the dining room with other patients for meals Concentration is improving Energy level is adequate Denies suicidal thoughts.  Denies suicidal intent or plan.  Denies having any HI.  Denies having psychotic symptoms.   Denies having side effects to current psychiatric medications.   We discussed changes to current medication regimen, including adding propranolol 10 mg p.o. twice daily for elevated pulse  of 128.  Discussed the following psychosocial stressors: Cessation of marijuana use this adversely affect overall psychiatric and medical wellbeing.  Patient acknowledge instructions with a smile to his face.  Total Time spent with patient: 45 minutes  Past Psychiatric History: Previous Psych Diagnoses: Major depressive disorder recurrent severe with psychosis Prior inpatient treatment: Patient was recently 2 days ago discharged from [facility] for MDD with psychotic features Current/prior outpatient treatment: Patient had an outpatient therapist in [place], however does not remember when last he saw the therapist. Prior rehab hx: Denies Psychotherapy hx: Yes History of suicide: Most suicide attempts, however patient has suicidal ideations History of homicide or aggression: Denies Psychiatric medication history: Yes, patient has had trial psychiatric medications of Haldol, Seroquel, olanzapine. Psychiatric medication compliance history: Compliance with meds Neuromodulation history: Denies Current Psychiatrist: Denies Current therapist: Denies   Past Medical History:  Past Medical History:  Diagnosis Date   Acute psychosis (HCC)    No past surgical history on file.  Family History: No family history on file.  Family Psychiatric  History: See H&P  Social History:  Social History   Substance and Sexual Activity  Alcohol Use None     Social History   Substance and Sexual Activity  Drug Use Yes   Types: Marijuana    Social History   Socioeconomic History   Marital status: Single    Spouse name: Not on file   Number of children: Not on file   Years of education: Not on file   Highest education level: Not on file  Occupational History   Not on file  Tobacco Use   Smoking status: Never   Smokeless tobacco: Never  Vaping Use   Vaping Use: Every day  Substance and Sexual Activity   Alcohol use: Not on file   Drug use: Yes    Types: Marijuana   Sexual  activity: Not on file  Other Topics Concern   Not on file  Social History Narrative   Not on file   Social Determinants of Health   Financial Resource Strain: Not on file  Food Insecurity: Patient Declined (05/20/2023)   Hunger Vital Sign    Worried About Running Out of Food in the Last Year: Patient declined    Ran Out of Food in the Last Year: Patient declined  Transportation Needs: Patient Declined (05/20/2023)   PRAPARE - Administrator, Civil Service (Medical): Patient declined    Lack of Transportation (Non-Medical): Patient declined  Physical Activity: Not on file  Stress: Not on file  Social Connections: Not on file   Additional Social History:     Sleep: Good  Appetite:  Good  Current Medications: Current Facility-Administered Medications  Medication Dose Route Frequency Provider Last Rate Last Admin   acetaminophen (TYLENOL) tablet 650 mg  650 mg Oral Q6H PRN Lenard Lance, FNP       alum & mag hydroxide-simeth (MAALOX/MYLANTA) 200-200-20 MG/5ML suspension 30 mL  30 mL Oral Q4H PRN Lenard Lance, FNP       diphenhydrAMINE (BENADRYL) capsule 50 mg  50 mg Oral TID PRN Lenard Lance, FNP   50 mg at 05/20/23 1949   Or   diphenhydrAMINE (BENADRYL) injection 50 mg  50 mg Intramuscular TID PRN Lenard Lance, FNP   50 mg at 05/20/23 1333   haloperidol (HALDOL) tablet 5 mg  5 mg Oral TID PRN Lenard Lance, FNP   5 mg at 05/20/23 1949   Or   haloperidol lactate (HALDOL) injection 5 mg  5 mg Intramuscular TID PRN Lenard Lance, FNP   5 mg at 05/20/23 1334   hydrocortisone cream 1 %   Topical BID Cecilie Lowers, FNP   1 Application at 05/22/23 1610   hydrOXYzine (ATARAX) tablet 25 mg  25 mg Oral TID PRN Lenard Lance, FNP   25 mg at 05/22/23 1257   LORazepam (ATIVAN) tablet 2 mg  2 mg Oral TID PRN Lenard Lance, FNP   2 mg at 05/20/23 1950   Or   LORazepam (ATIVAN) injection 2 mg  2 mg Intramuscular TID PRN Lenard Lance, FNP   2 mg at 05/20/23 1335   magnesium  hydroxide (MILK OF MAGNESIA) suspension 30 mL  30 mL Oral Daily PRN Lenard Lance, FNP       nicotine (NICODERM CQ - dosed in mg/24 hours) patch 21 mg  21 mg Transdermal Daily Massengill, Harrold Donath, MD   21 mg at 05/22/23 9604   QUEtiapine (SEROQUEL XR) 24 hr tablet 100 mg  100 mg Oral QHS Lenard Lance, FNP   100 mg at 05/21/23 2030   traZODone (DESYREL) tablet 50 mg  50 mg Oral QHS PRN Lenard Lance, FNP   50 mg at 05/21/23 2030    Lab Results: No results found for this or any previous visit (from the past 48 hour(s)).  Blood Alcohol level:  Lab Results  Component Value Date   ETH <10 05/19/2023   ETH <10 05/09/2023   Metabolic Disorder Labs: No results found for: "HGBA1C", "MPG" No results found for: "PROLACTIN" No results found for: "CHOL", "TRIG", "HDL", "CHOLHDL", "VLDL", "  LDLCALC"  Physical Findings: AIMS:  , ,  ,  ,    CIWA:    COWS:     Musculoskeletal: Strength & Muscle Tone: within normal limits Gait & Station: normal Patient leans: N/A  Psychiatric Specialty Exam:  Presentation  General Appearance:  Casual  Eye Contact: Fair  Speech: Clear and Coherent; Normal Rate  Speech Volume: Normal  Handedness: Right  Mood and Affect  Mood: Depressed  Affect: Congruent  Thought Process  Thought Processes: Coherent; Linear  Descriptions of Associations:Loose  Orientation:Full (Time, Place and Person)  Thought Content:Delusions; Tangential  History of Schizophrenia/Schizoaffective disorder:No  Duration of Psychotic Symptoms:Less than six months  Hallucinations:Hallucinations: None Description of Auditory Hallucinations: Denies  Ideas of Reference:Paranoia  Suicidal Thoughts:Suicidal Thoughts: No SI Active Intent and/or Plan: -- (Denies)  Homicidal Thoughts:Homicidal Thoughts: No HI Active Intent and/or Plan: -- (Denies)  Sensorium  Memory: Immediate Fair; Recent Fair  Judgment: Poor  Insight: Poor  Executive Functions   Concentration: Fair  Attention Span: Fair  Recall: [employer] of Knowledge: Fair  Language: Fair  Psychomotor Activity  Psychomotor Activity: Psychomotor Activity: Normal  Assets  Assets: Manufacturing systems engineer; Housing; Physical Health; Resilience; Social Support  Sleep  Sleep: Sleep: Good Number of Hours of Sleep: 11  Physical Exam: Physical Exam Vitals and nursing note reviewed.  HENT:     Head: Normocephalic.     Nose: Nose normal.     Mouth/Throat:     Mouth: Mucous membranes are moist.     Pharynx: Oropharynx is clear.  Eyes:     Extraocular Movements: Extraocular movements intact.     Pupils: Pupils are equal, round, and reactive to light.  Cardiovascular:     Rate and Rhythm: Tachycardia present.  Pulmonary:     Effort: Pulmonary effort is normal.  Abdominal:     Comments: Deferred  Genitourinary:    Comments: Deferred Musculoskeletal:        General: Normal range of motion.     Cervical back: Normal range of motion.  Skin:    General: Skin is warm.  Neurological:     General: No focal deficit present.     Mental Status: He is alert and oriented to person, place, and time.  Psychiatric:        Behavior: Behavior normal.     Comments: Agitated and anxious    Review of Systems  Constitutional:  Negative for chills and fever.  HENT:  Negative for sore throat.   Eyes:  Negative for blurred vision and double vision.  Respiratory:  Negative for cough, shortness of breath and wheezing.   Cardiovascular:  Negative for chest pain and palpitations.  Gastrointestinal:  Negative for abdominal pain, heartburn, nausea and vomiting.  Genitourinary: Negative.   Musculoskeletal: Negative.   Skin:  Negative for itching and rash.  Neurological:  Negative for dizziness, tingling, tremors, sensory change, speech change and headaches.  Endo/Heme/Allergies:        See allergy listing  Psychiatric/Behavioral:  Positive for depression and substance abuse. The  patient is nervous/anxious and has insomnia.    Blood pressure 139/77, pulse (!) 128, temperature 97.6 F (36.4 C), temperature source Oral, resp. rate 20, height 6\' 2"  (1.88 m), SpO2 98 %. Body mass index is 19.9 kg/m.  Treatment Plan Summary: Daily contact with patient to assess and evaluate symptoms and progress in treatment and Medication management Treatment Plan Summary: Daily contact with patient to assess and evaluate symptoms and progress in treatment and Medication  management   Physician Treatment Plan for Primary Diagnosis:  Assessment: MDD (major depressive disorder), recurrent episode, severe (HCC) GAD Substance-induced acute psychosis   Plan: Medication: Continue Seroquel XR 24-hour tablet 100 mg p.o. nightly for mood stabilization Continue trazodone tablet 50 mg p.o. nightly as needed for insomnia Continue hydroxyzine tablet 25 mg p.o. 3 times daily as needed for anxiety Nicotine dose in mg per 24 hours 21 mg patch transdermal over 24 hours daily for smoking cessation Initiate Inderal 10 mg tablet p.o. twice daily for elevated pulse  Agitation protocol: Benadryl capsule 50 mg p.o. or IM 3 times daily as needed agitation   Haldol tablets 5 mg po IM 3 times daily as needed agitation   Lorazepam tablet 2 mg p.o. or IM 3 times daily as needed agitation     Other PRN Medications -Acetaminophen 650 mg every 6 as needed/mild pain -Maalox 30 mL oral every 4 as needed/digestion -Magnesium hydroxide 30 mL daily as needed/mild constipation   -- The risks/benefits/side-effects/alternatives to this medication were discussed in detail with the patient and time was given for questions. The patient consents to medication trial.              -- Encouraged patient to participate  in unit milieu and in scheduled group therapies    EKG: NSR, ventricular rate 96, QT/QTc 332/419   Safety and Monitoring: Voluntary admission to inpatient psychiatric unit for safety, stabilization  and treatment Daily contact with patient to assess and evaluate symptoms and progress in treatment Patient's case to be discussed in multi-disciplinary team meeting Observation Level : q15 minute checks Vital signs: q12 hours Precautions: suicide, but pt currently verbally contracts for safety on unit    Discharge Planning: Social work and case management to assist with discharge planning and identification of hospital follow-up needs prior to discharge Estimated LOS: 5-7 days Discharge Concerns: Need to establish a safety plan; Medication compliance and effectiveness Discharge Goals: Return home with outpatient referrals for mental health follow-up including medication management/psychotherapy.   Long Term Goal(s): Improvement in symptoms so as ready for discharge   Short Term Goals: Ability to identify changes in lifestyle to reduce recurrence of condition will improve, Ability to verbalize feelings will improve, Ability to disclose and discuss suicidal ideas, Ability to demonstrate self-control will improve, Ability to identify and develop effective coping behaviors will improve, Ability to maintain clinical measurements within normal limits will improve, Compliance with prescribed medications will improve, and Ability to identify triggers associated with substance abuse/mental health issues will improve   Physician Treatment Plan for Secondary Diagnosis: Principal Problem:   MDD (major depressive disorder), recurrent episode, severe (HCC)   I certify that inpatient services furnished can reasonably be expected to improve the patient's condition.      Cecilie Lowers, FNP 05/22/2023, 1:23 PM

## 2023-05-22 NOTE — Group Note (Signed)
Date:  05/22/2023 Time:  8:45 PM  Group Topic/Focus:  Wrap-Up Group:   The focus of this group is to help patients review their daily goal of treatment and discuss progress on daily workbooks.    Participation Level:  Did Not Attend   Scot Dock 05/22/2023, 8:45 PM

## 2023-05-22 NOTE — Progress Notes (Signed)
   05/22/23 2045  Psych Admission Type (Psych Patients Only)  Admission Status Involuntary  Psychosocial Assessment  Patient Complaints Other (Comment) (pt c/o ready to discharge)  Eye Contact Fair  Facial Expression Anxious  Affect Appropriate to circumstance;Anxious  Speech Logical/coherent  Interaction Minimal  Motor Activity Restless  Appearance/Hygiene Improved  Behavior Characteristics Cooperative;Anxious  Mood Anxious;Pleasant  Thought Process  Coherency WDL  Content WDL  Delusions None reported or observed  Perception WDL  Hallucination None reported or observed  Judgment Poor  Confusion None  Danger to Self  Current suicidal ideation? Denies  Danger to Others  Danger to Others None reported or observed

## 2023-05-23 MED ORDER — PAROXETINE HCL 20 MG PO TABS
20.0000 mg | ORAL_TABLET | Freq: Every day | ORAL | Status: DC
  Administered 2023-05-23 – 2023-05-26 (×4): 20 mg via ORAL
  Filled 2023-05-23 (×3): qty 1
  Filled 2023-05-23: qty 2
  Filled 2023-05-23 (×2): qty 1

## 2023-05-23 MED ORDER — FLUOXETINE HCL 20 MG PO CAPS
20.0000 mg | ORAL_CAPSULE | Freq: Every day | ORAL | Status: DC
  Filled 2023-05-23 (×2): qty 1

## 2023-05-23 NOTE — Progress Notes (Signed)
Prn Po hydroxyzine 25 mg given to patient for complaint of anxiety at 1259. Patient tolerated medication well with no side effect noted. Staff will continue to provide support to patient.

## 2023-05-23 NOTE — Progress Notes (Signed)
Patient reports having a panic attack earlier and is working on relaxing and calming himself down. Support given and safety maintained with 15 min checks.  05/23/23 1941  Psych Admission Type (Psych Patients Only)  Admission Status Involuntary  Psychosocial Assessment  Patient Complaints Anxiety  Eye Contact Fair  Facial Expression Anxious  Affect Appropriate to circumstance;Anxious  Speech Logical/coherent  Interaction Minimal  Motor Activity Restless  Appearance/Hygiene Improved  Behavior Characteristics Cooperative;Appropriate to situation;Anxious  Mood Anxious;Pleasant  Thought Process  Coherency WDL  Content WDL  Delusions None reported or observed  Perception WDL  Hallucination None reported or observed  Judgment Poor  Confusion None  Danger to Self  Current suicidal ideation? Denies  Danger to Others  Danger to Others None reported or observed

## 2023-05-23 NOTE — [facility] Group Notes (Signed)
Adult Psychoeducational Group Note  Date:  05/23/2023 Time:  10:53 AM  Group Topic/Focus:  Goals Group:   The focus of this group is to help patients establish daily goals to achieve during treatment and discuss how the patient can incorporate goal setting into their daily lives to aide in recovery. Orientation:   The focus of this group is to educate the patient on the purpose and policies of crisis stabilization and provide a format to answer questions about their admission.  The group details unit policies and expectations of patients while admitted.  Participation Level:  Did Not Attend  Participation Quality:    Affect:    Cognitive:    Insight:   Engagement in Group:    Modes of Intervention:    Additional Comments:    Jacob [name] 05/23/2023, 10:53 AM

## 2023-05-23 NOTE — Progress Notes (Signed)
Prn agitation protocol ( ativan 2 mg, haldol 5 mg, and benadryl 50 mg) for increased agitation and anxiety. The previous atarax 25 mg given was not effective. Patient keeps pacing back and forth, and saying he's having panic attack. Patient tolerated PO medication well with no side effect noted. Mom is in the unit at this time visiting. Staff will continue to provide support and reassurance to patient.

## 2023-05-23 NOTE — Plan of Care (Signed)

## 2023-05-23 NOTE — Group Note (Signed)
Date:  05/23/2023 Time:  10:00 PM  Group Topic/Focus:  Wrap-Up Group:   The focus of this group is to help patients review their daily goal of treatment and discuss progress on daily workbooks.    Participation Level:  Active  Participation Quality:  Appropriate  Affect:  Appropriate  Cognitive:  Appropriate  Insight: Appropriate  Engagement in Group:  Engaged  Modes of Intervention:  Education and Exploration  Additional Comments:  Patient attended and participated in group tonight. He reports that today was good. He went to the gym, went for his meals and spoke with some of his family members.  Lita Mains [facility] 05/23/2023, 10:00 PM

## 2023-05-23 NOTE — Progress Notes (Signed)
[facility] MD Progress Note  05/23/2023 2:55 PM Jacob [name]  MRN:  [id number]  Subjective: Jacob [name] report, " When am I going to be discharged, I am ready to go home."  Principal Problem: MDD (major depressive disorder), recurrent, severe, with psychosis (HCC) Diagnosis: Principal Problem:   MDD (major depressive disorder), recurrent, severe, with psychosis (HCC) Active Problems:   MDD (major depressive disorder), recurrent episode, severe (HCC)  Reason for admission:  Jacob [name] is a 27 year old Caucasian male with prior psychiatric history significant for MDD, generalized anxiety, and psychosis who presents involuntarily to [facility] from [facility] health emergency department at [facility] after stabilization for acute psychosis in the context of smoking 2 bowls of marijuana.   Yesterday the psychiatry team made the following recommendations:  Continue Seroquel XR 24-hour tablet 100 mg p.o. nightly for mood stabilization Continue trazodone tablet 50 mg p.o. nightly as needed for insomnia Continue hydroxyzine tablet 25 mg p.o. 3 times daily as needed for anxiety Continue Nicotine dose in mg per 24 hours 21 mg patch transdermal over 24 hours daily for smoking cessation Continue Inderal 10 mg tablet p.o. twice daily for elevated pulse Initiated paroxetine 20 mg p.o. daily for depression  On assessment today, the pt reports that his mood is less depressed. Reports that anxiety is at manageable level Nursing staff report patient sleeping over 9 hours last night with aid of trazodone 50 mg p.o. as needed for insomnia Appetite is good, observed going to the dining room with other patients for meals Concentration is improving however speech is repetitive, demanding he is ready to be discharged. Energy level is adequate Denies suicidal thoughts.  Denies suicidal intent or plan.  Denies having any HI.  Denies having psychotic symptoms.   Denies having side  effects to current psychiatric medications.   We discussed adjustment to current medication regimen, including adding paroxetine 20 mg p.o. daily for depression.  Patient in agreement to addition of paroxetine for depression.  Discussed the following psychosocial stressors: Cessation of marijuana use this adversely affect overall psychiatric and medical wellbeing.  Patient acknowledge instructions, however continuously asks "when I am I going home, I am ready to be discharged."  Total Time spent with patient: 45 minutes  Past Psychiatric History: Previous Psych Diagnoses: Major depressive disorder recurrent severe with psychosis Prior inpatient treatment: Patient was recently 2 days ago discharged from [facility] for MDD with psychotic features Current/prior outpatient treatment: Patient had an outpatient therapist in [place], however does not remember when last he saw the therapist. Prior rehab hx: Denies Psychotherapy hx: Yes History of suicide: Most suicide attempts, however patient has suicidal ideations History of homicide or aggression: Denies Psychiatric medication history: Yes, patient has had trial psychiatric medications of Haldol, Seroquel, olanzapine. Psychiatric medication compliance history: Compliance with meds Neuromodulation history: Denies Current Psychiatrist: Denies Current therapist: Denies   Past Medical History:  Past Medical History:  Diagnosis Date   Acute psychosis (HCC)    No past surgical history on file.  Family History: No family history on file.  Family Psychiatric  History: See H&P  Social History:  Social History   Substance and Sexual Activity  Alcohol Use None     Social History   Substance and Sexual Activity  Drug Use Yes   Types: Marijuana    Social History   Socioeconomic History   Marital status: Single    Spouse name: Not on file   Number of children:  Not on file   Years of education: Not on file   Highest  education level: Not on file  Occupational History   Not on file  Tobacco Use   Smoking status: Never   Smokeless tobacco: Never  Vaping Use   Vaping Use: Every day  Substance and Sexual Activity   Alcohol use: Not on file   Drug use: Yes    Types: Marijuana   Sexual activity: Not on file  Other Topics Concern   Not on file  Social History Narrative   Not on file   Social Determinants of Health   Financial Resource Strain: Not on file  Food Insecurity: Patient Declined (05/20/2023)   Hunger Vital Sign    Worried About Running Out of Food in the Last Year: Patient declined    Ran Out of Food in the Last Year: Patient declined  Transportation Needs: Patient Declined (05/20/2023)   PRAPARE - Administrator, Civil Service (Medical): Patient declined    Lack of Transportation (Non-Medical): Patient declined  Physical Activity: Not on file  Stress: Not on file  Social Connections: Not on file   Additional Social History:     Sleep: Good  Appetite:  Good  Current Medications: Current Facility-Administered Medications  Medication Dose Route Frequency Provider Last Rate Last Admin   acetaminophen (TYLENOL) tablet 650 mg  650 mg Oral Q6H PRN Lenard Lance, FNP       alum & mag hydroxide-simeth (MAALOX/MYLANTA) 200-200-20 MG/5ML suspension 30 mL  30 mL Oral Q4H PRN Lenard Lance, FNP       diphenhydrAMINE (BENADRYL) capsule 50 mg  50 mg Oral TID PRN Lenard Lance, FNP   50 mg at 05/20/23 1949   Or   diphenhydrAMINE (BENADRYL) injection 50 mg  50 mg Intramuscular TID PRN Lenard Lance, FNP   50 mg at 05/20/23 1333   haloperidol (HALDOL) tablet 5 mg  5 mg Oral TID PRN Lenard Lance, FNP   5 mg at 05/20/23 1949   Or   haloperidol lactate (HALDOL) injection 5 mg  5 mg Intramuscular TID PRN Lenard Lance, FNP   5 mg at 05/20/23 1334   hydrocortisone cream 1 %   Topical BID Cecilie Lowers, FNP   1 Application at 05/22/23 1642   hydrOXYzine (ATARAX) tablet 25 mg  25 mg Oral TID  PRN Lenard Lance, FNP   25 mg at 05/23/23 1259   LORazepam (ATIVAN) tablet 2 mg  2 mg Oral TID PRN Lenard Lance, FNP   2 mg at 05/20/23 1950   Or   LORazepam (ATIVAN) injection 2 mg  2 mg Intramuscular TID PRN Lenard Lance, FNP   2 mg at 05/20/23 1335   magnesium hydroxide (MILK OF MAGNESIA) suspension 30 mL  30 mL Oral Daily PRN Lenard Lance, FNP       nicotine (NICODERM CQ - dosed in mg/24 hours) patch 21 mg  21 mg Transdermal Daily Massengill, Harrold Donath, MD   21 mg at 05/23/23 1610   propranolol (INDERAL) tablet 10 mg  10 mg Oral BID Cecilie Lowers, FNP   10 mg at 05/23/23 9604   QUEtiapine (SEROQUEL XR) 24 hr tablet 100 mg  100 mg Oral QHS Lenard Lance, FNP   100 mg at 05/22/23 2031   traZODone (DESYREL) tablet 50 mg  50 mg Oral QHS PRN Lenard Lance, FNP   50 mg at 05/22/23 2031  Lab Results: No results found for this or any previous visit (from the past 48 hour(s)).  Blood Alcohol level:  Lab Results  Component Value Date   ETH <10 05/19/2023   ETH <10 05/09/2023   Metabolic Disorder Labs: No results found for: "HGBA1C", "MPG" No results found for: "PROLACTIN" No results found for: "CHOL", "TRIG", "HDL", "CHOLHDL", "VLDL", "LDLCALC"  Physical Findings: AIMS:  , ,  ,  ,    CIWA:    COWS:     Musculoskeletal: Strength & Muscle Tone: within normal limits Gait & Station: normal Patient leans: N/A  Psychiatric Specialty Exam:  Presentation  General Appearance:  Casual  Eye Contact: Good  Speech: Clear and Coherent; Normal Rate  Speech Volume: Normal  Handedness: Right  Mood and Affect  Mood: -- (Improving)  Affect: Congruent  Thought Process  Thought Processes: Coherent; Linear  Descriptions of Associations:Loose  Orientation:Full (Time, Place and Person)  Thought Content:Delusions; Tangential  History of Schizophrenia/Schizoaffective disorder:No  Duration of Psychotic Symptoms:Less than six months  Hallucinations:Hallucinations:  None Description of Auditory Hallucinations: Denies  Ideas of Reference:Delusions  Suicidal Thoughts:Suicidal Thoughts: No SI Active Intent and/or Plan: -- (Denies)  Homicidal Thoughts:Homicidal Thoughts: No HI Active Intent and/or Plan: -- (Denies)  Sensorium  Memory: Immediate Fair; Recent Fair  Judgment: Poor  Insight: Poor  Executive Functions  Concentration: Fair  Attention Span: Fair  Recall: [employer] of Knowledge: Fair  Language: Fair  Psychomotor Activity  Psychomotor Activity: Psychomotor Activity: Normal  Assets  Assets: Manufacturing systems engineer; Housing; Resilience; Physical Health; Social Support  Sleep  Sleep: Sleep: Good Number of Hours of Sleep: 9  Physical Exam: Physical Exam Vitals and nursing note reviewed.  HENT:     Head: Normocephalic.     Nose: Nose normal.     Mouth/Throat:     Mouth: Mucous membranes are moist.     Pharynx: Oropharynx is clear.  Eyes:     Extraocular Movements: Extraocular movements intact.     Pupils: Pupils are equal, round, and reactive to light.  Cardiovascular:     Rate and Rhythm: Tachycardia present.  Pulmonary:     Effort: Pulmonary effort is normal.  Abdominal:     Comments: Deferred  Genitourinary:    Comments: Deferred Musculoskeletal:        General: Normal range of motion.     Cervical back: Normal range of motion.  Skin:    General: Skin is warm.  Neurological:     General: No focal deficit present.     Mental Status: He is alert and oriented to person, place, and time.  Psychiatric:        Behavior: Behavior normal.     Comments: Agitated and anxious    Review of Systems  Constitutional:  Negative for chills and fever.  HENT:  Negative for sore throat.   Eyes:  Negative for blurred vision and double vision.  Respiratory:  Negative for cough, shortness of breath and wheezing.   Cardiovascular:  Negative for chest pain and palpitations.  Gastrointestinal:  Negative for  abdominal pain, heartburn, nausea and vomiting.  Genitourinary: Negative.   Musculoskeletal: Negative.   Skin:  Negative for itching and rash.  Neurological:  Negative for dizziness, tingling, tremors, sensory change, speech change and headaches.  Endo/Heme/Allergies:        See allergy listing  Psychiatric/Behavioral:  Positive for depression. The patient is nervous/anxious and has insomnia.    Blood pressure 138/89, pulse 97, temperature 97.8 F (36.6  C), temperature source Oral, resp. rate 16, height 6\' 2"  (1.88 m), SpO2 99 %. Body mass index is 19.9 kg/m.  Treatment Plan Summary: Daily contact with patient to assess and evaluate symptoms and progress in treatment and Medication management Physician Treatment Plan for Primary Diagnosis:  Assessment: MDD (major depressive disorder), recurrent episode, severe (HCC) GAD Substance-induced acute psychosis   Plan: Medication: Continue Seroquel XR 24-hour tablet 100 mg p.o. nightly for mood stabilization Continue trazodone tablet 50 mg p.o. nightly as needed for insomnia Continue hydroxyzine tablet 25 mg p.o. 3 times daily as needed for anxiety Nicotine dose in mg per 24 hours 21 mg patch transdermal over 24 hours daily for smoking cessation Continue Inderal 10 mg tablet p.o. twice daily for elevated pulse Initiate paroxetine 20 mg p.o. daily for depression starting 05/23/2023  Agitation protocol: Benadryl capsule 50 mg p.o. or IM 3 times daily as needed agitation   Haldol tablets 5 mg po IM 3 times daily as needed agitation   Lorazepam tablet 2 mg p.o. or IM 3 times daily as needed agitation     Other PRN Medications -Acetaminophen 650 mg every 6 as needed/mild pain -Maalox 30 mL oral every 4 as needed/digestion -Magnesium hydroxide 30 mL daily as needed/mild constipation   -- The risks/benefits/side-effects/alternatives to this medication were discussed in detail with the patient and time was given for questions. The patient  consents to medication trial.              -- Encouraged patient to participate  in unit milieu and in scheduled group therapies    EKG: NSR, ventricular rate 96, QT/QTc 332/419   Safety and Monitoring: Voluntary admission to inpatient psychiatric unit for safety, stabilization and treatment Daily contact with patient to assess and evaluate symptoms and progress in treatment Patient's case to be discussed in multi-disciplinary team meeting Observation Level : q15 minute checks Vital signs: q12 hours Precautions: suicide, but pt currently verbally contracts for safety on unit    Discharge Planning: Social work and case management to assist with discharge planning and identification of hospital follow-up needs prior to discharge Estimated LOS: 5-7 days Discharge Concerns: Need to establish a safety plan; Medication compliance and effectiveness Discharge Goals: Return home with outpatient referrals for mental health follow-up including medication management/psychotherapy.   Long Term Goal(s): Improvement in symptoms so as ready for discharge   Short Term Goals: Ability to identify changes in lifestyle to reduce recurrence of condition will improve, Ability to verbalize feelings will improve, Ability to disclose and discuss suicidal ideas, Ability to demonstrate self-control will improve, Ability to identify and develop effective coping behaviors will improve, Ability to maintain clinical measurements within normal limits will improve, Compliance with prescribed medications will improve, and Ability to identify triggers associated with substance abuse/mental health issues will improve   Physician Treatment Plan for Secondary Diagnosis: Principal Problem:   MDD (major depressive disorder), recurrent episode, severe (HCC)   I certify that inpatient services furnished can reasonably be expected to improve the patient's condition.      Cecilie Lowers, FNP 05/23/2023, 2:55 PMPatient ID: Jacob [name], male   DOB: Mar 17, 1996, 27 y.o.   MRN: [id number]

## 2023-05-23 NOTE — Progress Notes (Signed)
   05/23/23 0800  Psych Admission Type (Psych Patients Only)  Admission Status Involuntary  Psychosocial Assessment  Patient Complaints Anxiety  Eye Contact Fair  Facial Expression Anxious  Affect Appropriate to circumstance  Speech Logical/coherent  Interaction Minimal  Motor Activity Restless  Appearance/Hygiene Improved  Behavior Characteristics Cooperative;Appropriate to situation  Mood Anxious;Pleasant  Thought Process  Coherency WDL  Content WDL  Delusions None reported or observed  Perception WDL  Hallucination None reported or observed  Judgment Poor  Confusion None  Danger to Self  Current suicidal ideation? Denies  Agreement Not to Harm Self Yes  Description of Agreement Verbal

## 2023-05-24 MED ORDER — QUETIAPINE FUMARATE ER 200 MG PO TB24
200.0000 mg | ORAL_TABLET | Freq: Every day | ORAL | Status: DC
  Administered 2023-05-24 – 2023-05-25 (×2): 200 mg via ORAL
  Filled 2023-05-24 (×5): qty 1

## 2023-05-24 NOTE — Progress Notes (Addendum)
[facility] MD Progress Note  05/24/2023 3:06 PM Jacob [name]  MRN:  [id number]  Subjective: Jacob [name] report, "I think I was agitated last night due to withdrawing from the marijuana that I took on Thursday, 05/20/2023.  The nurse gave me Ativan with other agitation protocol which greatly help."  Principal Problem: MDD (major depressive disorder), recurrent, severe, with psychosis (HCC) Diagnosis: Principal Problem:   MDD (major depressive disorder), recurrent, severe, with psychosis (HCC) Active Problems:   MDD (major depressive disorder), recurrent episode, severe (HCC)  Reason for admission:  Jacob [name] is a 27 year old Caucasian male with prior psychiatric history significant for MDD, generalized anxiety, and psychosis who presents involuntarily to [facility] from [facility] health emergency department at [facility] after stabilization for acute psychosis in the context of smoking 2 bowls of marijuana.   Yesterday the psychiatry team made the following recommendations:  Increase Seroquel XR 24-hour tablet from 100 mg to 200 mg p.o. nightly for mood stabilization 05/24/2023 Continue trazodone tablet 50 mg p.o. nightly as needed for insomnia Continue hydroxyzine tablet 25 mg p.o. 3 times daily as needed for anxiety Continue Nicotine dose in mg per 24 hours 21 mg patch transdermal over 24 hours daily for smoking cessation Continue Inderal 10 mg tablet p.o. twice daily for elevated pulse Continue paroxetine 20 mg p.o. daily for depression  On assessment today, the pt reports that his mood is less depressed. Reports that anxiety is at manageable level this morning.  Nursing staff report patient was agitated last night requesting Ativan and other agitation protocol.  Ativan for agitation protocol was discontinued today due to patient's continuous request for this medication.  He Seroquel 100 mg p.o. q. nightly was increased to 200 mg p.o. q. Nightly for mood  stability. Nursing staff report patient sleeping over 9.0 hours last night with aid of trazodone 50 mg p.o. as needed for insomnia Appetite is good, observed going to the dining room with other patients for meals Concentration is improving however, speech is repetitive, demanding he is ready to be discharged. Energy level is adequate Denies suicidal thoughts.  Denies suicidal intent or plan.  Denies having any HI.  Denies having psychotic symptoms.   Denies having side effects to current psychiatric medications.   We discussed adjustment to current medication regimen, including discontinuing Ativan 2 mg p.o. for agitation, and increasing Seroquel from 100 mg to 200 mg p.o. nightly for mood stability.  Patient in agreement to medication adjustments.  Discussed the following psychosocial stressors: Cessation of marijuana use this adversely affect overall psychiatric and medical wellbeing.  Patient acknowledge instructions.  Total Time spent with patient: 45 minutes  Past Psychiatric History: Previous Psych Diagnoses: Major depressive disorder recurrent severe with psychosis Prior inpatient treatment: Patient was recently 2 days ago discharged from [facility] [place] for MDD with psychotic features Current/prior outpatient treatment: Patient had an outpatient therapist in [place], however does not remember when last he saw the therapist. Prior rehab hx: Denies Psychotherapy hx: Yes History of suicide: No suicide attempts, however patient has suicidal ideations History of homicide or aggression: Denies Psychiatric medication history: Yes, patient has had trial psychiatric medications of Haldol, Seroquel, olanzapine. Psychiatric medication compliance history: Compliance with meds Neuromodulation history: Denies Current Psychiatrist: Denies Current therapist: Denies   Past Medical History:  Past Medical History:  Diagnosis Date   Acute psychosis (HCC)    No past surgical history on  file.  Family History: No family history on  file.  Family Psychiatric  History: See H&P  Social History:  Social History   Substance and Sexual Activity  Alcohol Use None     Social History   Substance and Sexual Activity  Drug Use Yes   Types: Marijuana    Social History   Socioeconomic History   Marital status: Single    Spouse name: Not on file   Number of children: Not on file   Years of education: Not on file   Highest education level: Not on file  Occupational History   Not on file  Tobacco Use   Smoking status: Never   Smokeless tobacco: Never  Vaping Use   Vaping Use: Every day  Substance and Sexual Activity   Alcohol use: Not on file   Drug use: Yes    Types: Marijuana   Sexual activity: Not on file  Other Topics Concern   Not on file  Social History Narrative   Not on file   Social Determinants of Health   Financial Resource Strain: Not on file  Food Insecurity: Patient Declined (05/20/2023)   Hunger Vital Sign    Worried About Running Out of Food in the Last Year: Patient declined    Ran Out of Food in the Last Year: Patient declined  Transportation Needs: Patient Declined (05/20/2023)   PRAPARE - Administrator, Civil Service (Medical): Patient declined    Lack of Transportation (Non-Medical): Patient declined  Physical Activity: Not on file  Stress: Not on file  Social Connections: Not on file   Additional Social History:     Sleep: Good  Appetite:  Good  Current Medications: Current Facility-Administered Medications  Medication Dose Route Frequency Provider Last Rate Last Admin   acetaminophen (TYLENOL) tablet 650 mg  650 mg Oral Q6H PRN Lenard Lance, FNP       alum & mag hydroxide-simeth (MAALOX/MYLANTA) 200-200-20 MG/5ML suspension 30 mL  30 mL Oral Q4H PRN Lenard Lance, FNP       diphenhydrAMINE (BENADRYL) capsule 50 mg  50 mg Oral TID PRN Lenard Lance, FNP   50 mg at 05/23/23 1847   Or   diphenhydrAMINE (BENADRYL)  injection 50 mg  50 mg Intramuscular TID PRN Lenard Lance, FNP   50 mg at 05/20/23 1333   haloperidol (HALDOL) tablet 5 mg  5 mg Oral TID PRN Lenard Lance, FNP   5 mg at 05/23/23 1847   Or   haloperidol lactate (HALDOL) injection 5 mg  5 mg Intramuscular TID PRN Lenard Lance, FNP   5 mg at 05/20/23 1334   hydrocortisone cream 1 %   Topical BID Cecilie Lowers, FNP   Given at 05/23/23 1608   hydrOXYzine (ATARAX) tablet 25 mg  25 mg Oral TID PRN Lenard Lance, FNP   25 mg at 05/23/23 1834   LORazepam (ATIVAN) tablet 2 mg  2 mg Oral TID PRN Lenard Lance, FNP   2 mg at 05/23/23 1847   Or   LORazepam (ATIVAN) injection 2 mg  2 mg Intramuscular TID PRN Lenard Lance, FNP   2 mg at 05/20/23 1335   magnesium hydroxide (MILK OF MAGNESIA) suspension 30 mL  30 mL Oral Daily PRN Lenard Lance, FNP       nicotine (NICODERM CQ - dosed in mg/24 hours) patch 21 mg  21 mg Transdermal Daily Massengill, Harrold Donath, MD   21 mg at 05/24/23 0812   PARoxetine (PAXIL) tablet  20 mg  20 mg Oral Daily Brylinn Teaney C, FNP   20 mg at 05/24/23 0810   propranolol (INDERAL) tablet 10 mg  10 mg Oral BID Alan Mulder C, FNP   10 mg at 05/24/23 0810   QUEtiapine (SEROQUEL XR) 24 hr tablet 100 mg  100 mg Oral QHS Lenard Lance, FNP   100 mg at 05/23/23 2035   traZODone (DESYREL) tablet 50 mg  50 mg Oral QHS PRN Lenard Lance, FNP   50 mg at 05/23/23 2035    Lab Results: No results found for this or any previous visit (from the past 48 hour(s)).  Blood Alcohol level:  Lab Results  Component Value Date   ETH <10 05/19/2023   ETH <10 05/09/2023   Metabolic Disorder Labs: No results found for: "HGBA1C", "MPG" No results found for: "PROLACTIN" No results found for: "CHOL", "TRIG", "HDL", "CHOLHDL", "VLDL", "LDLCALC"  Physical Findings: AIMS:  , ,  ,  ,    CIWA:    COWS:     Musculoskeletal: Strength & Muscle Tone: within normal limits Gait & Station: normal Patient leans: N/A  Psychiatric Specialty Exam:  Presentation   General Appearance:  Casual  Eye Contact: Good  Speech: Clear and Coherent; Normal Rate  Speech Volume: Normal  Handedness: Right  Mood and Affect  Mood: Euthymic  Affect: Congruent  Thought Process  Thought Processes: Coherent; Linear  Descriptions of Associations:Intact  Orientation:Full (Time, Place and Person)  Thought Content:Delusions; Tangential  History of Schizophrenia/Schizoaffective disorder:No  Duration of Psychotic Symptoms:Less than six months  Hallucinations:Hallucinations: None Description of Auditory Hallucinations: denies  Ideas of Reference:Delusions  Suicidal Thoughts:Suicidal Thoughts: No SI Active Intent and/or Plan: -- (denies)  Homicidal Thoughts:Homicidal Thoughts: No HI Active Intent and/or Plan: -- (denies)  Sensorium  Memory: Immediate Fair; Recent Fair  Judgment: Poor  Insight: Poor  Executive Functions  Concentration: Fair  Attention Span: Fair  Recall: [employer] of Knowledge: Fair  Language: Fair  Psychomotor Activity  Psychomotor Activity: Psychomotor Activity: Normal  Assets  Assets: Communication Skills; Resilience; Physical Health; Social Support  Sleep  Sleep: Sleep: Good Number of Hours of Sleep: 9  Physical Exam: Physical Exam Vitals and nursing note reviewed.  HENT:     Head: Normocephalic.     Nose: Nose normal.     Mouth/Throat:     Mouth: Mucous membranes are moist.     Pharynx: Oropharynx is clear.  Eyes:     Extraocular Movements: Extraocular movements intact.     Pupils: Pupils are equal, round, and reactive to light.  Cardiovascular:     Rate and Rhythm: Normal rate.     Pulses: Normal pulses.  Pulmonary:     Effort: Pulmonary effort is normal.  Abdominal:     Comments: Deferred  Genitourinary:    Comments: Deferred Musculoskeletal:        General: Normal range of motion.     Cervical back: Normal range of motion.  Skin:    General: Skin is warm.   Neurological:     General: No focal deficit present.     Mental Status: He is alert and oriented to person, place, and time.  Psychiatric:        Mood and Affect: Mood normal.        Behavior: Behavior normal.     Comments: Agitated and anxious    Review of Systems  Constitutional:  Negative for chills and fever.  HENT:  Negative for sore  throat.   Eyes:  Negative for blurred vision and double vision.  Respiratory:  Negative for cough, shortness of breath and wheezing.   Cardiovascular:  Negative for chest pain and palpitations.  Gastrointestinal:  Negative for abdominal pain, heartburn, nausea and vomiting.  Genitourinary: Negative.   Musculoskeletal: Negative.   Skin:  Negative for itching and rash.  Neurological:  Negative for dizziness, tingling, tremors, sensory change, speech change and headaches.  Endo/Heme/Allergies:        See allergy listing  Psychiatric/Behavioral:  Positive for depression. The patient is nervous/anxious and has insomnia.    Blood pressure 131/86, pulse 94, temperature 98.2 F (36.8 C), temperature source Oral, resp. rate 16, height 6\' 2"  (1.88 m), SpO2 98 %. Body mass index is 19.9 kg/m.  Treatment Plan Summary: Daily contact with patient to assess and evaluate symptoms and progress in treatment and Medication management  Physician Treatment Plan for Primary Diagnosis:  Assessment: MDD (major depressive disorder), recurrent episode, severe (HCC) GAD Substance-induced acute psychosis   Plan: Medication: Increase Seroquel XR 24-hour tablet from 100 mg to 200 mg p.o. nightly for mood stabilization. Continue trazodone tablet 50 mg p.o. nightly as needed for insomnia Continue hydroxyzine tablet 25 mg p.o. 3 times daily as needed for anxiety Nicotine dose in mg per 24 hours 21 mg patch transdermal over 24 hours daily for smoking cessation Continue Inderal 10 mg tablet p.o. twice daily for elevated pulse Continue Paroxetine 20 mg p.o. daily for  depression   Agitation protocol: Benadryl capsule 50 mg p.o. or IM 3 times daily as needed agitation   Haldol tablets 5 mg po IM 3 times daily as needed agitation   Discontinue lorazepam tablet 2 mg p.o. or IM 3 times daily as needed agitation 05/24/2023   Other PRN Medications -Acetaminophen 650 mg every 6 as needed/mild pain -Maalox 30 mL oral every 4 as needed/digestion -Magnesium hydroxide 30 mL daily as needed/mild constipation   -- The risks/benefits/side-effects/alternatives to this medication were discussed in detail with the patient and time was given for questions. The patient consents to medication trial.              -- Encouraged patient to participate  in unit milieu and in scheduled group therapies    EKG: NSR, ventricular rate 96, QT/QTc 332/419   Safety and Monitoring: Voluntary admission to inpatient psychiatric unit for safety, stabilization and treatment Daily contact with patient to assess and evaluate symptoms and progress in treatment Patient's case to be discussed in multi-disciplinary team meeting Observation Level : q15 minute checks Vital signs: q12 hours Precautions: suicide, but pt currently verbally contracts for safety on unit    Discharge Planning: Social work and case management to assist with discharge planning and identification of hospital follow-up needs prior to discharge Estimated LOS: 5-7 days Discharge Concerns: Need to establish a safety plan; Medication compliance and effectiveness Discharge Goals: Return home with outpatient referrals for mental health follow-up including medication management/psychotherapy.   Long Term Goal(s): Improvement in symptoms so as ready for discharge   Short Term Goals: Ability to identify changes in lifestyle to reduce recurrence of condition will improve, Ability to verbalize feelings will improve, Ability to disclose and discuss suicidal ideas, Ability to demonstrate self-control will improve, Ability to  identify and develop effective coping behaviors will improve, Ability to maintain clinical measurements within normal limits will improve, Compliance with prescribed medications will improve, and Ability to identify triggers associated with substance abuse/mental health issues will improve  Physician Treatment Plan for Secondary Diagnosis: Principal Problem:   MDD (major depressive disorder), recurrent episode, severe (HCC)   I certify that inpatient services furnished can reasonably be expected to improve the patient's condition.      Cecilie Lowers, FNP 05/24/2023, 3:06 PMPatient ID: Jacob [name], male   DOB: 11-20-1995, 27 y.o.   MRN: [id number] Patient ID: Jacob [name], male   DOB: Apr 24, 1996, 27 y.o.   MRN: [id number]

## 2023-05-24 NOTE — [facility] Suicide Risk Assessment (Signed)
[facility] INPATIENT:  Family/Significant Other Suicide Prevention Education  Suicide Prevention Education:  Education Completed; Jacob [name] ,[phone]  (name of family member/significant other) has been identified by the patient as the family member/significant other with whom the patient will be residing, and identified as the person(s) who will aid the patient in the event of a mental health crisis (suicidal ideations/suicide attempt).  With written consent from the patient, the family member/significant other has been provided the following suicide prevention education, prior to the and/or following the discharge of the patient.   Safety planning was completed with mom. Mom shared that this was patient 3rd episode and usually they can handle patient but this time she said that he had gotten worse. Mom continued on saying that patient wasn't remembering things, crying/yelling running naked down the road, becoming anxious, and constantly pacing. Mom confirmed that she will pick patient up at [place] between 1:45-2:00PM and that there are no guns or weapons in home.   The suicide prevention education provided includes the following: Suicide risk factors Suicide prevention and interventions National Suicide Hotline telephone number [facility] assessment telephone number [facility] Emergency Assistance 911 [facility] and/or Residential Mobile Crisis Unit telephone number  Request made of family/significant other to: Remove weapons (e.g., guns, rifles, knives), all items previously/currently identified as safety concern.   Remove drugs/medications (over-the-counter, prescriptions, illicit drugs), all items previously/currently identified as a safety concern.  The family member/significant other verbalizes understanding of the suicide prevention education information provided.  The family member/significant other agrees to remove the items of safety concern listed above.  Jacob [name] 05/24/2023, 12:38 PM

## 2023-05-24 NOTE — Group Note (Signed)
Recreation Therapy Group Note   Group Topic:Stress Management  Group Date: 05/24/2023 Start Time: 1010 End Time: 1048 Facilitators: Hollace Michelli-McCall, LRT,CTRS Location: [address] Dayroom   Goal Area(s) Addresses:  Patient will identify positive stress management techniques. Patient will identify benefits of using stress management post d/c.  Group Description: Deep Breathing, Meditation.  LRT and patients discussed some of the ways in which each of them relieves their stress. LRT and patients then went through the exercise of deep breathing. LRT explained to patients the importance of using deep breathing to help calm anxiety, stress, heart rate, etc. LRT then played a meditation for the patients that focused on not holding ones self back and envisioning what they wanted for their future selves and how to get there. Patients were to sit relaxed, with eyes closed and focus as much as possible on the meditation as it played.    Affect/Mood: N/A   Participation Level: Did not attend    Clinical Observations/Individualized Feedback:     Plan: Continue to engage patient in RT group sessions 2-3x/week.   Darrel Baroni-McCall, LRT,CTRS 05/24/2023 12:55 PM

## 2023-05-24 NOTE — [facility] Group Notes (Signed)
Adult Psychoeducational Group Note  Date:  05/24/2023 Time:  8:42 PM  Group Topic/Focus:  Wrap-Up Group:   The focus of this group is to help patients review their daily goal of treatment and discuss progress on daily workbooks.  Participation Level:  Active  Participation Quality:  Appropriate  Affect:  Appropriate  Cognitive:  Appropriate  Insight: Appropriate  Engagement in Group:  Engaged  Modes of Intervention:  Discussion and Support  Additional Comments:  Pt told that today was a generally good day on the unit, the highlight of which was playing basketball outside in the courtyard. "The fresh air was nice." On the subject of goals for this week, Pt mentioned hoping to discharge soon. He rated his day a 5 out of 10.  Jacob [name] 05/24/2023, 8:42 PM

## 2023-05-24 NOTE — Progress Notes (Signed)
   05/24/23 0811 05/24/23 0900  Psych Admission Type (Psych Patients Only)  Admission Status Involuntary  --   Psychosocial Assessment  Patient Complaints Anxiety  --   Eye Contact Fair  --   Facial Expression Anxious  --   Affect Appropriate to circumstance  --   Speech Logical/coherent  --   Interaction Minimal  --   Motor Activity Restless  --   Appearance/Hygiene Improved  --   Behavior Characteristics Cooperative;Appropriate to situation  --   Mood Anxious;Pleasant  --   Thought Process  Coherency WDL  --   Content WDL  --   Delusions None reported or observed  --   Perception WDL  --   Hallucination None reported or observed  --   Judgment Poor  --   Confusion None  --   Danger to Self  Current suicidal ideation? Denies  --   Agreement Not to Harm Self Yes  --   Description of Agreement Verbal  --   Danger to Others  Danger to Others None reported or observed  --   Danger to Others Abnormal  Harmful Behavior to others  --  No threats or harm toward other people

## 2023-05-24 NOTE — Progress Notes (Signed)
   05/24/23 0606  15 Minute Checks  Location Bedroom  Visual Appearance Calm  Behavior Sleeping  Sleep (Behavioral Health Patients Only)  Calculate sleep? (Click Yes once per 24 hr at 0600 safety check) Yes  Documented sleep last 24 hours 9.5

## 2023-05-24 NOTE — Progress Notes (Signed)
Pt reports having a "good" day. He reports sleeping well without any difficulties last night and a good appetite. Tonight, pt rated his anxiety a 4 on a scale of 0-10 (10 being the worst). He reports that it has decreased compared to admission. The coping skills he has been using to manage his anxiety are deep breathing, walking away from the situation, and distracting himself. He denies any side effects from his medications. He has been calm and cooperative on the unit. He also attended and participated in wrap-up group. Pt denies SI/HI and AVH. Active listening, reassurance, and support provided. Q 15 min safety checks continue. Pt's safety has been maintained.   05/24/23 1940  Psych Admission Type (Psych Patients Only)  Admission Status Involuntary  Psychosocial Assessment  Patient Complaints Anxiety  Eye Contact Fair  Facial Expression Anxious  Affect Anxious;Appropriate to circumstance  Speech Logical/coherent  Interaction Forwards little;Minimal  Motor Activity Fidgety  Appearance/Hygiene Improved  Behavior Characteristics Cooperative;Appropriate to situation;Anxious  Mood Anxious;Pleasant  Thought Process  Coherency WDL  Content WDL  Delusions None reported or observed  Perception WDL  Hallucination None reported or observed  Judgment Poor  Confusion None  Danger to Self  Current suicidal ideation? Denies  Agreement Not to Harm Self Yes  Description of Agreement verbally contracts for safety  Danger to Others  Danger to Others None reported or observed  Danger to Others Abnormal  Harmful Behavior to others No threats or harm toward other people  Destructive Behavior No threats or harm toward property

## 2023-05-24 NOTE — Plan of Care (Signed)
  Problem: Education: Goal: Knowledge of [facility] Education information/materials will improve Outcome: Progressing Goal: Emotional status will improve Outcome: Progressing Goal: Mental status will improve Outcome: Progressing Goal: Verbalization of understanding the information provided will improve Outcome: Progressing   Problem: Activity: Goal: Interest or engagement in activities will improve Outcome: Progressing Goal: Sleeping patterns will improve Outcome: Progressing   

## 2023-05-25 NOTE — [facility] Group Notes (Signed)
Adult Psychoeducational Group Note  Date:  05/25/2023 Time:  8:53 PM  Group Topic/Focus:  Wrap-Up Group:   The focus of this group is to help patients review their daily goal of treatment and discuss progress on daily workbooks.  Participation Level:    Participation Quality:    Affect:    Cognitive:    Insight:   Engagement in Group:    Modes of Intervention:    Additional Comments:  Pt did not attend group today.  Satira Anis 05/25/2023, 8:53 PM

## 2023-05-25 NOTE — Plan of Care (Signed)
  Problem: Education: Goal: Knowledge of [facility] Education information/materials will improve Outcome: Progressing Goal: Emotional status will improve Outcome: Progressing Goal: Mental status will improve Outcome: Progressing Goal: Verbalization of understanding the information provided will improve Outcome: Progressing   Problem: Activity: Goal: Interest or engagement in activities will improve Outcome: Progressing Goal: Sleeping patterns will improve Outcome: Progressing   

## 2023-05-25 NOTE — Progress Notes (Signed)
[facility] MD Progress Note  05/25/2023 5:22 PM Jacob [name]  MRN:  [id number]  Principal Problem: MDD (major depressive disorder), recurrent, severe, with psychosis (HCC) Diagnosis: Principal Problem:   MDD (major depressive disorder), recurrent, severe, with psychosis (HCC) Active Problems:   MDD (major depressive disorder), recurrent episode, severe (HCC)  Reason for admission:  Jacob [name] is a 27 year old Caucasian male with prior psychiatric history significant for MDD, generalized anxiety, and psychosis who presents involuntarily to [facility] from [facility] health emergency department at [facility] after stabilization for acute psychosis in the context of smoking 2 bowls of marijuana.   Yesterday the psychiatry team made the following recommendations:  Continue Seroquel XR 24-hour tablet 200 mg p.o. nightly for mood stabilization  Continue trazodone tablet 50 mg p.o. nightly as needed for insomnia Continue hydroxyzine tablet 25 mg p.o. 3 times daily as needed for anxiety Continue Nicotine dose in mg per 24 hours 21 mg patch transdermal over 24 hours daily for smoking cessation Continue Inderal 10 mg tablet p.o. twice daily for elevated pulse Continue paroxetine 20 mg p.o. daily for depression  On assessment today, the pt reports that his mood is euthymic, improved since admission, and stable. Denies feeling down, depressed, or sad.  Reports that anxiety symptoms are at manageable level.  Sleep is stable. Appetite is stable.  Concentration is without complaint.  Energy level is adequate. Denies having any suicidal thoughts. Denies having any suicidal intent and plan.  Denies having any HI.  Denies having psychotic symptoms.   Denies having side effects to current psychiatric medications.   Discussed discharge planning to include: How to identify the signs of impending crisis, use of internal coping strategies, reaching out to friends and family can  help  navigate a crisis, and a list of mental health professionals and agencies to call.  Discussed the following psychosocial stressors: Cessation of marijuana use this adversely affect overall psychiatric and medical wellbeing.  Patient acknowledge instructions.  Total Time spent with patient:30 minutes  Past Psychiatric History: Previous Psych Diagnoses: Major depressive disorder recurrent severe with psychosis Prior inpatient treatment: Patient was recently 2 days ago discharged from [facility] for MDD with psychotic features Current/prior outpatient treatment: Patient had an outpatient therapist in [place], however does not remember when last he saw the therapist. Prior rehab hx: Denies Psychotherapy hx: Yes History of suicide: No suicide attempts, however patient has suicidal ideations History of homicide or aggression: Denies Psychiatric medication history: Yes, patient has had trial psychiatric medications of Haldol, Seroquel, olanzapine. Psychiatric medication compliance history: Compliance with meds Neuromodulation history: Denies Current Psychiatrist: Denies Current therapist: Denies   Past Medical History:  Past Medical History:  Diagnosis Date   Acute psychosis (HCC)    No past surgical history on file.  Family History: No family history on file.  Family Psychiatric  History: See H&P  Social History:  Social History   Substance and Sexual Activity  Alcohol Use None     Social History   Substance and Sexual Activity  Drug Use Yes   Types: Marijuana    Social History   Socioeconomic History   Marital status: Single    Spouse name: Not on file   Number of children: Not on file   Years of education: Not on file   Highest education level: Not on file  Occupational History   Not on file  Tobacco Use   Smoking status: Never   Smokeless tobacco: Never  Vaping Use   Vaping Use: Every day  Substance and Sexual Activity   Alcohol use: Not on file    Drug use: Yes    Types: Marijuana   Sexual activity: Not on file  Other Topics Concern   Not on file  Social History Narrative   Not on file   Social Determinants of Health   Financial Resource Strain: Not on file  Food Insecurity: Patient Declined (05/20/2023)   Hunger Vital Sign    Worried About Running Out of Food in the Last Year: Patient declined    Ran Out of Food in the Last Year: Patient declined  Transportation Needs: Patient Declined (05/20/2023)   PRAPARE - Administrator, Civil Service (Medical): Patient declined    Lack of Transportation (Non-Medical): Patient declined  Physical Activity: Not on file  Stress: Not on file  Social Connections: Not on file   Additional Social History:     Sleep: Good  Appetite:  Good  Current Medications: Current Facility-Administered Medications  Medication Dose Route Frequency Provider Last Rate Last Admin   acetaminophen (TYLENOL) tablet 650 mg  650 mg Oral Q6H PRN Lenard Lance, FNP       alum & mag hydroxide-simeth (MAALOX/MYLANTA) 200-200-20 MG/5ML suspension 30 mL  30 mL Oral Q4H PRN Lenard Lance, FNP       diphenhydrAMINE (BENADRYL) capsule 50 mg  50 mg Oral TID PRN Lenard Lance, FNP   50 mg at 05/23/23 1847   Or   diphenhydrAMINE (BENADRYL) injection 50 mg  50 mg Intramuscular TID PRN Lenard Lance, FNP   50 mg at 05/20/23 1333   haloperidol (HALDOL) tablet 5 mg  5 mg Oral TID PRN Lenard Lance, FNP   5 mg at 05/23/23 1847   Or   haloperidol lactate (HALDOL) injection 5 mg  5 mg Intramuscular TID PRN Lenard Lance, FNP   5 mg at 05/20/23 1334   hydrocortisone cream 1 %   Topical BID Cecilie Lowers, FNP   Given at 05/23/23 1608   hydrOXYzine (ATARAX) tablet 25 mg  25 mg Oral TID PRN Lenard Lance, FNP   25 mg at 05/24/23 1732   magnesium hydroxide (MILK OF MAGNESIA) suspension 30 mL  30 mL Oral Daily PRN Lenard Lance, FNP       nicotine (NICODERM CQ - dosed in mg/24 hours) patch 21 mg  21 mg Transdermal Daily  Massengill, Harrold Donath, MD   21 mg at 05/25/23 0911   PARoxetine (PAXIL) tablet 20 mg  20 mg Oral Daily Latrell Potempa C, FNP   20 mg at 05/25/23 0733   propranolol (INDERAL) tablet 10 mg  10 mg Oral BID Alan Mulder C, FNP   10 mg at 05/25/23 1621   QUEtiapine (SEROQUEL XR) 24 hr tablet 200 mg  200 mg Oral QHS Cydnee Fuquay C, FNP   200 mg at 05/24/23 2037   traZODone (DESYREL) tablet 50 mg  50 mg Oral QHS PRN Lenard Lance, FNP   50 mg at 05/24/23 2037    Lab Results: No results found for this or any previous visit (from the past 48 hour(s)).  Blood Alcohol level:  Lab Results  Component Value Date   ETH <10 05/19/2023   ETH <10 05/09/2023   Metabolic Disorder Labs: No results found for: "HGBA1C", "MPG" No results found for: "PROLACTIN" No results found for: "CHOL", "TRIG", "HDL", "CHOLHDL", "VLDL", "LDLCALC"  Physical Findings: AIMS:  , ,  ,  ,  CIWA:    COWS:     Musculoskeletal: Strength & Muscle Tone: within normal limits Gait & Station: normal Patient leans: N/A  Psychiatric Specialty Exam:  Presentation  General Appearance:  Casual  Eye Contact: Good  Speech: Clear and Coherent; Normal Rate  Speech Volume: Normal  Handedness: Right  Mood and Affect  Mood: Euthymic  Affect: Congruent  Thought Process  Thought Processes: Coherent; Linear  Descriptions of Associations:Intact  Orientation:Full (Time, Place and Person)  Thought Content:Delusions; Logical  History of Schizophrenia/Schizoaffective disorder:No  Duration of Psychotic Symptoms:Less than six months  Hallucinations:Hallucinations: None Description of Auditory Hallucinations: Denies  Ideas of Reference:None  Suicidal Thoughts:Suicidal Thoughts: No SI Active Intent and/or Plan: -- (Denies)  Homicidal Thoughts:Homicidal Thoughts: No HI Active Intent and/or Plan: -- (Denies)  Sensorium  Memory: Immediate Fair; Recent Fair  Judgment: Fair  Insight: Fair  Art therapist   Concentration: Fair  Attention Span: Fair  Recall: Fair  Fund of Knowledge: Fair  Language: Fair  Psychomotor Activity  Psychomotor Activity: Psychomotor Activity: Normal  Assets  Assets: Communication Skills; Desire for Improvement; Housing; Physical Health; Resilience; Social Support  Sleep  Sleep: Sleep: Good Number of Hours of Sleep: 9.5  Physical Exam: Physical Exam Vitals and nursing note reviewed.  HENT:     Head: Normocephalic.     Nose: Nose normal.     Mouth/Throat:     Mouth: Mucous membranes are moist.     Pharynx: Oropharynx is clear.  Eyes:     Extraocular Movements: Extraocular movements intact.     Pupils: Pupils are equal, round, and reactive to light.  Cardiovascular:     Rate and Rhythm: Normal rate.     Pulses: Normal pulses.  Pulmonary:     Effort: Pulmonary effort is normal.  Abdominal:     Comments: Deferred  Genitourinary:    Comments: Deferred Musculoskeletal:        General: Normal range of motion.     Cervical back: Normal range of motion.  Skin:    General: Skin is warm.  Neurological:     General: No focal deficit present.     Mental Status: He is alert and oriented to person, place, and time.  Psychiatric:        Mood and Affect: Mood normal.        Behavior: Behavior normal.     Comments: Agitated and anxious    Review of Systems  Constitutional:  Negative for chills and fever.  HENT:  Negative for sore throat.   Eyes:  Negative for blurred vision and double vision.  Respiratory:  Negative for cough, shortness of breath and wheezing.   Cardiovascular:  Negative for chest pain and palpitations.  Gastrointestinal:  Negative for abdominal pain, heartburn, nausea and vomiting.  Genitourinary: Negative.   Musculoskeletal: Negative.   Skin:  Negative for itching and rash.  Neurological:  Negative for dizziness, tingling, tremors, sensory change, speech change and headaches.  Endo/Heme/Allergies:        See allergy  listing  Psychiatric/Behavioral:  Positive for depression. The patient is nervous/anxious and has insomnia.    Blood pressure (!) 139/99, pulse 90, temperature 98.2 F (36.8 C), temperature source Oral, resp. rate 16, height 6\' 2"  (1.88 m), SpO2 98 %. Body mass index is 19.9 kg/m.  Treatment Plan Summary: Daily contact with patient to assess and evaluate symptoms and progress in treatment and Medication management  Physician Treatment Plan for Primary Diagnosis:  Assessment: MDD (major depressive disorder), recurrent  episode, severe (HCC) GAD Substance-induced acute psychosis   Plan: Medication: Continue Seroquel XR 24-hour tablet 200 mg p.o. nightly for mood stabilization. Continue trazodone tablet 50 mg p.o. nightly as needed for insomnia Continue hydroxyzine tablet 25 mg p.o. 3 times daily as needed for anxiety Nicotine dose in mg per 24 hours 21 mg patch transdermal over 24 hours daily for smoking cessation Continue Inderal 10 mg tablet p.o. twice daily for elevated pulse Continue Paroxetine 20 mg p.o. daily for depression   Agitation protocol: Benadryl capsule 50 mg p.o. or IM 3 times daily as needed agitation   Haldol tablets 5 mg po IM 3 times daily as needed agitation   Discontinue lorazepam tablet 2 mg p.o. or IM 3 times daily as needed agitation 05/24/2023   Other PRN Medications -Acetaminophen 650 mg every 6 as needed/mild pain -Maalox 30 mL oral every 4 as needed/digestion -Magnesium hydroxide 30 mL daily as needed/mild constipation   -- The risks/benefits/side-effects/alternatives to this medication were discussed in detail with the patient and time was given for questions. The patient consents to medication trial.              -- Encouraged patient to participate  in unit milieu and in scheduled group therapies    EKG: NSR, ventricular rate 96, QT/QTc 332/419   Safety and Monitoring: Voluntary admission to inpatient psychiatric unit for safety, stabilization and  treatment Daily contact with patient to assess and evaluate symptoms and progress in treatment Patient's case to be discussed in multi-disciplinary team meeting Observation Level : q15 minute checks Vital signs: q12 hours Precautions: suicide, but pt currently verbally contracts for safety on unit    Discharge Planning: Social work and case management to assist with discharge planning and identification of hospital follow-up needs prior to discharge Estimated LOS: 5-7 days Discharge Concerns: Need to establish a safety plan; Medication compliance and effectiveness Discharge Goals: Return home with outpatient referrals for mental health follow-up including medication management/psychotherapy.   Long Term Goal(s): Improvement in symptoms so as ready for discharge   Short Term Goals: Ability to identify changes in lifestyle to reduce recurrence of condition will improve, Ability to verbalize feelings will improve, Ability to disclose and discuss suicidal ideas, Ability to demonstrate self-control will improve, Ability to identify and develop effective coping behaviors will improve, Ability to maintain clinical measurements within normal limits will improve, Compliance with prescribed medications will improve, and Ability to identify triggers associated with substance abuse/mental health issues will improve   Physician Treatment Plan for Secondary Diagnosis: Principal Problem:   MDD (major depressive disorder), recurrent episode, severe (HCC)   I certify that inpatient services furnished can reasonably be expected to improve the patient's condition.      Cecilie Lowers, FNP 05/25/2023, 5:22 PMPatient ID: Jacob [name], male   DOB: [birth year], 27 y.o.   MRN: [id number] Patient ID: Jacob [name], male   DOB: Aug 25, 1996, 27 y.o.   MRN: [id number] Patient ID: Jacob [name], male   DOB: Oct 18, 1996, 26 y.o.   MRN: [id number]

## 2023-05-25 NOTE — Progress Notes (Signed)
   05/25/23 0800  Psych Admission Type (Psych Patients Only)  Admission Status Involuntary  Psychosocial Assessment  Patient Complaints Anxiety  Eye Contact Fair  Facial Expression Anxious  Affect Anxious;Appropriate to circumstance  Speech Logical/coherent  Interaction Forwards little;Childlike  Motor Activity Fidgety  Appearance/Hygiene Improved  Behavior Characteristics Cooperative;Appropriate to situation  Mood Anxious;Apprehensive;Fearful;Irritable;Sullen;Pleasant  Thought Process  Coherency WDL  Content WDL  Delusions None reported or observed  Perception WDL  Hallucination None reported or observed  Judgment Poor  Confusion None  Danger to Self  Current suicidal ideation? Denies  Agreement Not to Harm Self Yes  Description of Agreement Verbally Contracts for safety  Danger to Others  Danger to Others None reported or observed  Danger to Others Abnormal  Harmful Behavior to others No threats or harm toward other people  Destructive Behavior No threats or harm toward property

## 2023-05-25 NOTE — Group Note (Signed)
Recreation Therapy Group Note   Group Topic:Health and Wellness  Group Date: 05/25/2023 Start Time: 1040 End Time: 1110 Facilitators: Muna Demers-McCall, LRT,CTRS Location: [address] Dayroom   Goal Area(s) Addresses:  Patient will define components of whole wellness. Patient will verbalize benefit of whole wellness.  Group Description:  Exercise. LRT and patients discussed the importance of physical wellbeing. LRT discussed with patients the importance of having the three elements of mental, physical and spiritual working together for [employer]	[place] health and wellbeing. LRT explained to patients they would be completing 30 minutes of exercise. Patients took turns leading the group in the exercises of their choosing. Patients were to take breaks or get water if needed.    Affect/Mood: N/A   Participation Level: Did not attend    Clinical Observations/Individualized Feedback:     Plan: Continue to engage patient in RT group sessions 2-3x/week.   Jerri Hargadon-McCall, LRT,CTRS  05/25/2023 1:51 PM

## 2023-05-25 NOTE — Progress Notes (Signed)
   05/25/23 0600  15 Minute Checks  Location Bedroom  Visual Appearance Calm  Behavior Composed  Sleep (Behavioral Health Patients Only)  Calculate sleep? (Click Yes once per 24 hr at 0600 safety check) Yes  Documented sleep last 24 hours 7.5

## 2023-05-26 DIAGNOSIS — F332 Major depressive disorder, recurrent severe without psychotic features: Secondary | ICD-10-CM

## 2023-05-26 MED ORDER — PAROXETINE HCL 20 MG PO TABS: 20.0000 mg | ORAL_TABLET | Freq: Every day | ORAL | 0 refills | Status: DC

## 2023-05-26 MED ORDER — TRAZODONE HCL 50 MG PO TABS: 50.0000 mg | ORAL_TABLET | Freq: Every evening | ORAL | 0 refills | Status: DC | PRN

## 2023-05-26 MED ORDER — NICOTINE 21 MG/24HR TD PT24: 21.0000 mg | MEDICATED_PATCH | Freq: Every day | TRANSDERMAL | 0 refills | Status: DC

## 2023-05-26 MED ORDER — PROPRANOLOL HCL 10 MG PO TABS: 10.0000 mg | ORAL_TABLET | Freq: Two times a day (BID) | ORAL | 0 refills | Status: DC

## 2023-05-26 MED ORDER — HYDROXYZINE HCL 25 MG PO TABS: 25.0000 mg | ORAL_TABLET | Freq: Three times a day (TID) | ORAL | 0 refills | Status: DC | PRN

## 2023-05-26 MED ORDER — QUETIAPINE FUMARATE ER 200 MG PO TB24: 200.0000 mg | ORAL_TABLET | Freq: Every day | ORAL | 0 refills | Status: DC

## 2023-05-26 MED ORDER — HYDROCORTISONE 1 % EX CREA: TOPICAL_CREAM | Freq: Two times a day (BID) | CUTANEOUS | 0 refills | Status: DC
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
## 2023-05-26 NOTE — Progress Notes (Signed)
Pt discharged to lobby. Pt was stable and appreciative at that time. All papers and prescriptions were given and valuables returned. Verbal understanding expressed. Denies SI/HI and A/VH. Pt given opportunity to express concerns and ask questions.  

## 2023-05-26 NOTE — Progress Notes (Signed)
  [facility] Adult Case Management Discharge Plan :  Will you be returning to the same living situation after discharge:  Yes,  with parents At discharge, do you have transportation home?: Yes,  mother Do you have the ability to pay for your medications: Yes,  has insurance  Release of information consent forms completed and in the chart;  Patient's signature needed at discharge.  Patient to Follow up at:  Follow-up Information     Dois Davenport, MD. Schedule an appointment as soon as possible for a visit.   Specialty: Family Medicine Why: Please call to schedule an appointment with your provider for medication management services, as we were unable to contact prior to your discharge. Contact information: [address] [place] [address] [phone]         [facility] Follow up on 06/02/2023.   Why: You have a hospital follow up appointment for therapy services on 06/02/23 at 8:30 am.  This will be a Virtual telehealth appointment.  Medication management services are also available. Contact information: [address]  [address] [place] [address] [phone]                 Next level of care provider has access to [facility] Link:no  Safety Planning and Suicide Prevention discussed: Yes,  Caro Hight     Has patient been referred to the Quitline?: Patient refused referral for treatment  Patient has been referred for addiction treatment: No known substance use disorder.  Marinda Elk, LCSW 05/26/2023, 9:28 AM

## 2023-05-26 NOTE — Discharge Summary (Addendum)
Physician Discharge Summary Note  Patient:  Jacob [name] is an 27 y.o., male MRN:  [id number] DOB:  09/28/1996 Patient phone:  [phone] (home)  Patient address:   [address] [place] [address],  Total Time spent with patient: 30 minutes  Date of Admission:  05/20/2023 Date of Discharge:  05/26/2023  Reason for Admission:  Jacob [name] is a 27 year old Caucasian male with prior psychiatric history significant for MDD, generalized anxiety, and psychosis who presents involuntarily to [facility] from [facility] health emergency department at [facility] after stabilization for acute psychosis in the context of smoking 2 bowls of marijuana.   Principal Problem: MDD (major depressive disorder), recurrent, severe, with psychosis (HCC) Discharge Diagnoses: Principal Problem:   MDD (major depressive disorder), recurrent, severe, with psychosis (HCC) Active Problems:   MDD (major depressive disorder), recurrent episode, severe (HCC)  Past Psychiatric History: Previous Psych Diagnoses: Major depressive disorder recurrent severe with psychosis Prior inpatient treatment: Patient was recently 2 days ago discharged from [facility] for MDD with psychotic features Current/prior outpatient treatment: Patient had an outpatient therapist in [place], however does not remember when last he saw the therapist. Prior rehab hx: Denies Psychotherapy hx: Yes History of suicide: Most suicide attempts, however patient has suicidal ideations History of homicide or aggression: Denies Psychiatric medication history: Yes, patient has had trial psychiatric medications of Haldol, Seroquel, olanzapine. Psychiatric medication compliance history: Compliance with meds Neuromodulation history: Denies Current Psychiatrist: Denies Current therapist: Denies   Past Medical History:  Past Medical History:  Diagnosis Date   Acute psychosis (HCC)    No past surgical  history on file.  Family History: No family history on file.  Family Psychiatric  History: See HPI  Social History:  Social History   Substance and Sexual Activity  Alcohol Use None     Social History   Substance and Sexual Activity  Drug Use Yes   Types: Marijuana    Social History   Socioeconomic History   Marital status: Single    Spouse name: Not on file   Number of children: Not on file   Years of education: Not on file   Highest education level: Not on file  Occupational History   Not on file  Tobacco Use   Smoking status: Never   Smokeless tobacco: Never  Vaping Use   Vaping Use: Every day  Substance and Sexual Activity   Alcohol use: Not on file   Drug use: Yes    Types: Marijuana   Sexual activity: Not on file  Other Topics Concern   Not on file  Social History Narrative   Not on file   Social Determinants of Health   Financial Resource Strain: Not on file  Food Insecurity: Patient Declined (05/20/2023)   Hunger Vital Sign    Worried About Running Out of Food in the Last Year: Patient declined    Ran Out of Food in the Last Year: Patient declined  Transportation Needs: Patient Declined (05/20/2023)   PRAPARE - Administrator, Civil Service (Medical): Patient declined    Lack of Transportation (Non-Medical): Patient declined  Physical Activity: Not on file  Stress: Not on file  Social Connections: Not on file   Hospital Course:  During the patient's hospitalization, patient had extensive initial psychiatric evaluation, and follow-up psychiatric evaluations every day.  Psychiatric diagnoses provided upon initial assessment:   MDD (major depressive disorder), recurrent episode, severe (HCC) GAD Substance-induced acute  psychosis  Patient's psychiatric medications were adjusted on admission:  Continue Seroquel XR 24-hour tablet 100 mg p.o. nightly for mood stabilization Continue trazodone tablet 50 mg p.o. nightly as needed for  insomnia Continue hydroxyzine tablet 25 mg p.o. 3 times daily as needed for anxiety Nicotine dose in mg per 24 hours 21 mg patch transdermal over 24 hours daily for smoking cessation   During the hospitalization, other adjustments were made to the patient's psychiatric medication regimen:  Seroquel was increased to 200 mg p.o. q. nightly for mood stabilization  Patient's care was discussed during the interdisciplinary team meeting every day during the hospitalization.  The patient denies having side effects to prescribed psychiatric medication.  Gradually, patient started adjusting to milieu. The patient was evaluated each day by a clinical provider to ascertain response to treatment. Improvement was noted by the patient's report of decreasing symptoms, improved sleep and appetite, affect, medication tolerance, behavior, and participation in unit programming.  Patient was asked each day to complete a self inventory noting mood, mental status, pain, new symptoms, anxiety and concerns.    Symptoms were reported as significantly decreased or resolved completely by discharge.   On day of discharge, the patient reports that their mood is stable. The patient denied having suicidal thoughts for more than 48 hours prior to discharge.  Patient denies having homicidal thoughts.  Patient denies having auditory hallucinations.  Patient denies any visual hallucinations or other symptoms of psychosis. The patient was motivated to continue taking medication with a goal of continued improvement in mental health.   The patient reports their target psychiatric symptoms of depression responded well to the psychiatric medications, and the patient reports overall benefit other psychiatric hospitalization. Supportive psychotherapy was provided to the patient. The patient also participated in regular group therapy while hospitalized. Coping skills, problem solving as well as relaxation therapies were also part of the unit  programming.  Labs were reviewed with the patient, and abnormal results were discussed with the patient.  The patient is able to verbalize their individual safety plan to this provider.  # It is recommended to the patient to continue psychiatric medications as prescribed, after discharge from the hospital.    # It is recommended to the patient to follow up with your outpatient psychiatric provider and PCP.  # It was discussed with the patient, the impact of alcohol, drugs, tobacco have been there overall psychiatric and medical wellbeing, and total abstinence from substance use was recommended the patient.ed.  # Prescriptions provided or sent directly to preferred pharmacy at discharge. Patient agreeable to plan. Given opportunity to ask questions. Appears to feel comfortable with discharge.    # In the event of worsening symptoms, the patient is instructed to call the crisis hotline, 911 and or go to the nearest ED for appropriate evaluation and treatment of symptoms. To follow-up with primary care provider for other medical issues, concerns and or health care needs  # Patient was discharged to home with a plan to follow up as noted below.  Physical Findings: AIMS:  , ,  ,  ,    CIWA:    COWS:     Musculoskeletal: Strength & Muscle Tone: within normal limits Gait & Station: normal Patient leans: N/A  Psychiatric Specialty Exam:  Presentation  General Appearance:  Casual  Eye Contact: Good  Speech: Clear and Coherent; Normal Rate  Speech Volume: Normal  Handedness: Right  Mood and Affect  Mood: Euthymic  Affect: Congruent  Thought Process  Thought Processes: Coherent; Linear  Descriptions of Associations:Intact  Orientation:Full (Time, Place and Person)  Thought Content:Delusions; Logical  History of Schizophrenia/Schizoaffective disorder:No  Duration of Psychotic Symptoms:Less than six months  Hallucinations:Hallucinations: None Description of  Auditory Hallucinations: Denies  Ideas of Reference:None  Suicidal Thoughts:Suicidal Thoughts: No SI Active Intent and/or Plan: -- (Denies)  Homicidal Thoughts:Homicidal Thoughts: No HI Active Intent and/or Plan: -- (Denies)  Sensorium  Memory: Immediate Fair; Recent Fair  Judgment: Fair  Insight: Fair  Art therapist  Concentration: Fair  Attention Span: Fair  Recall: [employer] of Knowledge: Fair  Language: Fair  Psychomotor Activity  Psychomotor Activity: Psychomotor Activity: Normal  Assets  Assets: Communication Skills; Desire for Improvement; Housing; Physical Health; Resilience; Social Support  Sleep  Sleep: Sleep: Good Number of Hours of Sleep: 9.5  Physical Exam: Physical Exam Vitals and nursing note reviewed.  HENT:     Head: Normocephalic.     Nose: Nose normal.     Mouth/Throat:     Mouth: Mucous membranes are moist.     Pharynx: Oropharynx is clear.  Eyes:     Extraocular Movements: Extraocular movements intact.     Pupils: Pupils are equal, round, and reactive to light.  Cardiovascular:     Rate and Rhythm: Tachycardia present.     Comments: Blood pressure 143/87, pulse 115.  Patient is asymptomatic.  Nursing staff to recheck vital signs.  Recheck vital signs:BP 142/87 Pulse 87     Pulmonary:     Effort: Pulmonary effort is normal.  Abdominal:     Comments: Deferred  Genitourinary:    Comments: Deferred Musculoskeletal:        General: Normal range of motion.     Cervical back: Normal range of motion.  Skin:    General: Skin is warm.  Neurological:     General: No focal deficit present.     Mental Status: He is alert and oriented to person, place, and time.  Psychiatric:        Mood and Affect: Mood normal.        Behavior: Behavior normal.        Thought Content: Thought content normal.    Review of Systems  Constitutional:  Negative for chills and fever.  HENT:  Negative for sore throat.   Eyes:  Negative.   Respiratory:  Negative for cough, shortness of breath and wheezing.   Cardiovascular:  Negative for chest pain and palpitations.       Blood pressure 143/87, pulse 115.  Patient is asymptomatic.  Nursing staff to recheck vital signs.  Recheck vital signs:BP 142/87 Pulse 87    Gastrointestinal:  Negative for abdominal pain, heartburn, nausea and vomiting.  Genitourinary: Negative.   Musculoskeletal: Negative.   Skin:  Negative for itching and rash.  Neurological:  Negative for dizziness, tingling, tremors and headaches.  Endo/Heme/Allergies:        See allergy listing  Psychiatric/Behavioral:  Positive for depression (Stable with medication). The patient is nervous/anxious (Improved with medication) and has insomnia (Improved with medication).    Blood pressure (!) 143/87, pulse (!) 115, temperature (!) 97.5 F (36.4 C), temperature source Oral, resp. rate 14, height 6\' 2"  (1.88 m), SpO2 98 %. Body mass index is 19.9 kg/m.  Social History   Tobacco Use  Smoking Status Never  Smokeless Tobacco Never   Tobacco Cessation:  A prescription for an FDA-approved tobacco cessation medication provided at discharge  Blood Alcohol level:  Lab Results  Component Value  Date   ETH <10 05/19/2023   ETH <10 05/09/2023   Metabolic Disorder Labs:  No results found for: "HGBA1C", "MPG" No results found for: "PROLACTIN" No results found for: "CHOL", "TRIG", "HDL", "CHOLHDL", "VLDL", "LDLCALC"  See Psychiatric Specialty Exam and Suicide Risk Assessment completed by Attending Physician prior to discharge.  Discharge destination:  Home  Is patient on multiple antipsychotic therapies at discharge:  No   Has Patient had three or more failed trials of antipsychotic monotherapy by history:  No  Recommended Plan for Multiple Antipsychotic Therapies: NA  Discharge Instructions     Increase activity slowly   Complete by: As directed       Allergies as of 05/26/2023        Reactions   Depakote [divalproex Sodium] Rash   Patient had reaction within 1 week of starting depakote developed red rash to trunk and bilateral upper extremities.    Latex Rash        Medication List     STOP taking these medications    Vitamin D (Ergocalciferol) 1.25 MG (50000 UNIT) Caps capsule Commonly known as: DRISDOL       TAKE these medications      Indication  hydrocortisone cream 1 % Apply topically 2 (two) times daily.  Indication: Itching, Rash   hydrOXYzine 25 MG tablet Commonly known as: ATARAX Take 1 tablet (25 mg total) by mouth 3 (three) times daily as needed for anxiety.  Indication: Feeling Anxious   nicotine 21 mg/24hr patch Commonly known as: NICODERM CQ - dosed in mg/24 hours Place 1 patch (21 mg total) onto the skin daily. Start taking on: May 27, 2023  Indication: Nicotine Addiction   PARoxetine 20 MG tablet Commonly known as: PAXIL Take 1 tablet (20 mg total) by mouth daily. Start taking on: May 27, 2023  Indication: Major Depressive Disorder   propranolol 10 MG tablet Commonly known as: INDERAL Take 1 tablet (10 mg total) by mouth 2 (two) times daily.  Indication: High Blood Pressure Disorder   QUEtiapine 200 MG 24 hr tablet Commonly known as: SEROQUEL XR Take 1 tablet (200 mg total) by mouth at bedtime. What changed:  medication strength how much to take  Indication: Major Depressive Disorder   traZODone 50 MG tablet Commonly known as: DESYREL Take 1 tablet (50 mg total) by mouth at bedtime as needed for sleep.  Indication: Trouble Sleeping        Follow-up Information     Dois Davenport, MD. Schedule an appointment as soon as possible for a visit.   Specialty: Family Medicine Why: Please call to schedule an appointment with your provider for medication management services, as we were unable to contact prior to your discharge. Contact information: [address] [place] [address] [phone]          [facility] Follow up on 06/02/2023.   Why: You have a hospital follow up appointment for therapy services on 06/02/23 at 8:30 am.  This will be a Virtual telehealth appointment.  Medication management services are also available. Contact information: [address]  [address] [place] [address] [phone]                Follow-up recommendations:   Discharge Recommendations:  The patient is being discharged with his family. Patient is to take his discharge medications as ordered.  See follow up above. We recommend that he participates in individual therapy to target uncontrollable agitation and substance abuse.  We  recommend that he participates in family therapy to target the conflict with his family, to improve communication skills and conflict resolution skills.  Family is to initiate/implement a contingency based behavioral model to address patient's behavior. We recommend that he gets AIMS scale, height, weight, blood pressure, fasting lipid panel, fasting blood sugar in three months from discharge if he's on atypical antipsychotics.  Patient will benefit from monitoring of recurrent suicidal ideation since patient is on antidepressant medication. The patient should abstain from all illicit substances and alcohol. If the patient's symptoms worsen or do not continue to improve or if the patient becomes actively suicidal or homicidal then it is recommended that the patient return to the closest hospital emergency room or call 911 for further evaluation and treatment. National Suicide Prevention Lifeline 1800-SUICIDE or [phone]. Please follow up with your primary medical doctor for all other medical needs.  The patient has been educated on the possible side effects to medications and she/her guardian is to contact a medical professional and inform outpatient provider of any new side effects of medication. He is to take regular diet and activity as tolerated.  Will  benefit from moderate daily exercise. Patient and Family was educated about removing/locking any firearms, medications or dangerous products from the home.  Activity:  As tolerated Diet:  Regular Diet  Signed: Cecilie Lowers, FNP 05/26/2023, 10:45 AM

## 2023-05-26 NOTE — Group Note (Signed)
Date:  05/26/2023 Time:  10:19 AM  Group Topic/Focus:  Goals Group:   The focus of this group is to help patients establish daily goals to achieve during treatment and discuss how the patient can incorporate goal setting into their daily lives to aide in recovery. Orientation:   The focus of this group is to educate the patient on the purpose and policies of crisis stabilization and provide a format to answer questions about their admission.  The group details unit policies and expectations of patients while admitted.    Participation Level:  Active  Participation Quality:  Appropriate  Affect:  Appropriate  Cognitive:  Appropriate  Insight: Appropriate  Engagement in Group:  Engaged  Modes of Intervention:  Discussion  Additional Comments:  Patient attended group and was attentive the duration of it. Patient's goal was to have a positive discharge.  Juliana Boling T Lorraine Lax 05/26/2023, 10:19 AM

## 2023-12-05 ENCOUNTER — Ambulatory Visit (HOSPITAL_COMMUNITY)
Admission: EM | Admit: 2023-12-05 | Discharge: 2023-12-05 | Disposition: A | Discharge status: Other Acute Inpatient Hospital | Payer: Commercial Managed Care - PPO

## 2023-12-05 ENCOUNTER — Encounter (HOSPITAL_COMMUNITY): Payer: Self-pay

## 2023-12-05 ENCOUNTER — Emergency Department (HOSPITAL_COMMUNITY)
Admission: EM | Admit: 2023-12-05 | Discharge: 2023-12-09 | Disposition: A | Discharge status: Home / Self Care | Payer: Commercial Managed Care - PPO | Attending: Emergency Medicine | Admitting: Emergency Medicine

## 2023-12-05 DIAGNOSIS — F1410 Cocaine abuse, uncomplicated: Secondary | ICD-10-CM | POA: Insufficient documentation

## 2023-12-05 DIAGNOSIS — F23 Brief psychotic disorder: Secondary | ICD-10-CM | POA: Diagnosis not present

## 2023-12-05 DIAGNOSIS — F29 Unspecified psychosis not due to a substance or known physiological condition: Secondary | ICD-10-CM | POA: Insufficient documentation

## 2023-12-05 DIAGNOSIS — F333 Major depressive disorder, recurrent, severe with psychotic symptoms: Secondary | ICD-10-CM | POA: Diagnosis present

## 2023-12-05 DIAGNOSIS — Z7689 Persons encountering health services in other specified circumstances: Secondary | ICD-10-CM

## 2023-12-05 DIAGNOSIS — Z91040 Latex allergy status: Secondary | ICD-10-CM | POA: Diagnosis not present

## 2023-12-05 DIAGNOSIS — F17200 Nicotine dependence, unspecified, uncomplicated: Secondary | ICD-10-CM | POA: Insufficient documentation

## 2023-12-05 DIAGNOSIS — F411 Generalized anxiety disorder: Secondary | ICD-10-CM | POA: Insufficient documentation

## 2023-12-05 DIAGNOSIS — Z046 Encounter for general psychiatric examination, requested by authority: Secondary | ICD-10-CM

## 2023-12-05 LAB — RAPID URINE DRUG SCREEN, HOSP PERFORMED
Amphetamines: NOT DETECTED
Barbiturates: NOT DETECTED
Benzodiazepines: NOT DETECTED
Cocaine: NOT DETECTED
Opiates: NOT DETECTED
Tetrahydrocannabinol: NOT DETECTED

## 2023-12-05 LAB — BASIC METABOLIC PANEL
Anion gap: 10 (ref 5–15)
BUN: 12 mg/dL (ref 6–20)
CO2: 24 mmol/L (ref 22–32)
Calcium: 9.4 mg/dL (ref 8.9–10.3)
Chloride: 104 mmol/L (ref 98–111)
Creatinine, Ser: 0.77 mg/dL (ref 0.61–1.24)
GFR, Estimated: >60 mL/min (ref >60)
Glucose, Bld: 91 mg/dL (ref 70–99)
Potassium: 3.7 mmol/L (ref 3.5–5.1)
Sodium: 138 mmol/L (ref 135–145)

## 2023-12-05 LAB — ETHANOL: Alcohol, Ethyl (B): <10 mg/dL (ref <10)

## 2023-12-05 LAB — SALICYLATE LEVEL: Salicylate Lvl: <7 mg/dL — ABNORMAL LOW (ref 7.0–30.0)

## 2023-12-05 LAB — URINALYSIS, ROUTINE W REFLEX MICROSCOPIC
Bilirubin Urine: NEGATIVE
Glucose, UA: NEGATIVE mg/dL
Hgb urine dipstick: NEGATIVE
Ketones, ur: NEGATIVE mg/dL
Leukocytes,Ua: NEGATIVE
Nitrite: NEGATIVE
Protein, ur: NEGATIVE mg/dL
Specific Gravity, Urine: 1.013 (ref 1.005–1.030)
pH: 6 (ref 5.0–8.0)

## 2023-12-05 LAB — CBC
HCT: 40.9 % (ref 39.0–52.0)
Hemoglobin: 14.2 g/dL (ref 13.0–17.0)
MCH: 30.1 pg (ref 26.0–34.0)
MCHC: 34.7 g/dL (ref 30.0–36.0)
MCV: 86.8 fL (ref 80.0–100.0)
Platelets: 236 10*3/uL (ref 150–400)
RBC: 4.71 MIL/uL (ref 4.22–5.81)
RDW: 11.2 % — ABNORMAL LOW (ref 11.5–15.5)
WBC: 6.9 10*3/uL (ref 4.0–10.5)
nRBC: 0 % (ref 0.0–0.2)

## 2023-12-05 LAB — ACETAMINOPHEN LEVEL: Acetaminophen (Tylenol), Serum: <10 ug/mL — ABNORMAL LOW (ref 10–30)

## 2023-12-05 MED ORDER — PAROXETINE HCL 20 MG PO TABS
20.0000 mg | ORAL_TABLET | Freq: Every day | ORAL | Status: DC
  Administered 2023-12-05 – 2023-12-09 (×5): 20 mg via ORAL
  Filled 2023-12-05 (×5): qty 1

## 2023-12-05 MED ORDER — QUETIAPINE FUMARATE ER 200 MG PO TB24
200.0000 mg | ORAL_TABLET | Freq: Every day | ORAL | Status: DC
Start: 2023-12-05 — End: 2023-12-07
  Administered 2023-12-05 – 2023-12-06 (×2): 200 mg via ORAL
  Filled 2023-12-05 (×3): qty 1

## 2023-12-05 MED ORDER — TRAZODONE HCL 50 MG PO TABS: 50.0000 mg | ORAL_TABLET | Freq: Every evening | ORAL | Status: DC | PRN

## 2023-12-05 MED ORDER — STERILE WATER FOR INJECTION IJ SOLN
INTRAMUSCULAR | Status: AC
  Administered 2023-12-05: 10 mL
  Filled 2023-12-05: qty 10

## 2023-12-05 MED ORDER — HALOPERIDOL LACTATE 5 MG/ML IJ SOLN
5.0000 mg | Freq: Once | INTRAMUSCULAR | Status: AC
  Administered 2023-12-05: 5 mg via INTRAMUSCULAR
  Filled 2023-12-05: qty 1

## 2023-12-05 MED ORDER — PROPRANOLOL HCL 20 MG PO TABS
10.0000 mg | ORAL_TABLET | Freq: Two times a day (BID) | ORAL | Status: DC
  Administered 2023-12-05 – 2023-12-09 (×8): 10 mg via ORAL
  Filled 2023-12-05 (×8): qty 1

## 2023-12-05 MED ORDER — HYDROXYZINE HCL 25 MG PO TABS: 25.0000 mg | ORAL_TABLET | Freq: Three times a day (TID) | ORAL | Status: DC | PRN

## 2023-12-05 MED ORDER — STERILE WATER FOR INJECTION IJ SOLN
INTRAMUSCULAR | Status: AC
  Administered 2023-12-05: 1.2 mL
  Filled 2023-12-05: qty 10

## 2023-12-05 MED ORDER — TRAZODONE HCL 50 MG PO TABS
50.0000 mg | ORAL_TABLET | Freq: Every evening | ORAL | Status: DC | PRN
Start: 2023-12-05 — End: 2023-12-09
  Administered 2023-12-07 – 2023-12-08 (×2): 50 mg via ORAL
  Filled 2023-12-05 (×2): qty 1

## 2023-12-05 MED ORDER — ZIPRASIDONE MESYLATE 20 MG IM SOLR
20.0000 mg | Freq: Once | INTRAMUSCULAR | Status: AC
  Administered 2023-12-05: 20 mg via INTRAMUSCULAR
  Filled 2023-12-05: qty 20

## 2023-12-05 MED ORDER — ZIPRASIDONE MESYLATE 20 MG IM SOLR
10.0000 mg | Freq: Once | INTRAMUSCULAR | Status: AC
  Administered 2023-12-05: 10 mg via INTRAMUSCULAR
  Filled 2023-12-05: qty 20

## 2023-12-05 MED ORDER — LORAZEPAM 2 MG/ML IJ SOLN
2.0000 mg | Freq: Once | INTRAMUSCULAR | Status: AC
  Administered 2023-12-05: 2 mg via INTRAMUSCULAR
  Filled 2023-12-05: qty 1

## 2023-12-05 NOTE — ED Notes (Signed)
Pt agitated and refusing EKG and vitals.   Did allow blood work and for [place] to change into scrubs. Pt belongings placed in locker 28.   Pt also pulled off one of his toenails and refuses to let staff see it

## 2023-12-05 NOTE — ED Notes (Signed)
Patient dc by provider

## 2023-12-05 NOTE — ED Provider Notes (Incomplete)
EMERGENCY DEPARTMENT AT [facility] Provider Note   CSN: [id number] Arrival date & time: 12/05/23  1015     History {Add pertinent medical, surgical, social history, OB history to HPI:1} No chief complaint on file.   Caster Waldinger is a 28 y.o. male.  HPI     Home Medications Prior to Admission medications   Medication Sig Start Date End Date Taking? Authorizing Provider  nicotine (NICODERM CQ - DOSED IN MG/24 HOURS) 21 mg/24hr patch Place 1 patch (21 mg total) onto the skin daily. Patient taking differently: Place 21 mg onto the skin daily as needed (smoking cessation). 05/27/23  Yes Ntuen, Jesusita Oka, FNP  propranolol (INDERAL) 10 MG tablet Take 1 tablet (10 mg total) by mouth 2 (two) times daily. 05/26/23  Yes Ntuen, Jesusita Oka, FNP  QUEtiapine (SEROQUEL XR) 200 MG 24 hr tablet Take 1 tablet (200 mg total) by mouth at bedtime. 05/26/23  Yes Ntuen, Jesusita Oka, FNP  traZODone (DESYREL) 50 MG tablet Take 1 tablet (50 mg total) by mouth at bedtime as needed for sleep. 05/26/23  Yes Ntuen, Jesusita Oka, FNP  hydrocortisone cream 1 % Apply topically 2 (two) times daily. Patient not taking: Reported on 12/05/2023 05/26/23   Cecilie Lowers, FNP  hydrOXYzine (ATARAX) 25 MG tablet Take 1 tablet (25 mg total) by mouth 3 (three) times daily as needed for anxiety. Patient not taking: Reported on 12/05/2023 05/26/23   Cecilie Lowers, FNP  PARoxetine (PAXIL) 20 MG tablet Take 1 tablet (20 mg total) by mouth daily. Patient not taking: Reported on 12/05/2023 05/27/23   Cecilie Lowers, FNP      Allergies    Depakote [divalproex sodium] and Latex    Review of Systems   Review of Systems  Physical Exam Updated Vital Signs BP 122/77 (BP Location: Right Arm)   Pulse 78   Temp (!) 97.4 F (36.3 C) (Axillary)   Resp 18   Ht 6\' 2"  (1.88 m)   Wt 72.6 kg   SpO2 98%   BMI 20.54 kg/m  Physical Exam  ED Results / Procedures / Treatments   Labs (all labs ordered are listed, but only abnormal  results are displayed) Labs Reviewed  CBC - Abnormal; Notable for the following components:      Result Value   RDW 11.2 (*)    All other components within normal limits  SALICYLATE LEVEL - Abnormal; Notable for the following components:   Salicylate Lvl <7.0 (*)    All other components within normal limits  ACETAMINOPHEN LEVEL - Abnormal; Notable for the following components:   Acetaminophen (Tylenol), Serum <10 (*)    All other components within normal limits  BASIC METABOLIC PANEL  ETHANOL  URINALYSIS, ROUTINE W REFLEX MICROSCOPIC  RAPID URINE DRUG SCREEN, HOSP PERFORMED    EKG EKG Interpretation Date/Time:  Sunday December 05 2023 11:59:00 EST Ventricular Rate:  68 PR Interval:  156 QRS Duration:  110 QT Interval:  374 QTC Calculation: 397 R Axis:   82  Text Interpretation: Normal sinus rhythm Normal ECG When compared with ECG of 19-May-2023 17:10, PREVIOUS ECG IS PRESENT Confirmed by Margarita Grizzle [phone]) on 12/05/2023 4:02:11 PM  Radiology No results found.  Procedures Procedures  {Document cardiac monitor, telemetry assessment procedure when appropriate:1}  Medications Ordered in ED Medications  hydrOXYzine (ATARAX) tablet 25 mg (has no administration in time range)  PARoxetine (PAXIL) tablet 20 mg (20 mg Oral Given 12/05/23 1725)  propranolol (INDERAL) tablet  10 mg (10 mg Oral Given 12/05/23 2146)  QUEtiapine (SEROQUEL XR) 24 hr tablet 200 mg (200 mg Oral Given 12/05/23 2146)  traZODone (DESYREL) tablet 50 mg (has no administration in time range)  LORazepam (ATIVAN) injection 2 mg (2 mg Intramuscular Given 12/05/23 1037)  haloperidol lactate (HALDOL) injection 5 mg (5 mg Intramuscular Given 12/05/23 1036)  ziprasidone (GEODON) injection 10 mg (10 mg Intramuscular Given 12/05/23 2001)  sterile water (preservative free) injection (10 mLs  Given 12/05/23 2001)    ED Course/ Medical Decision Making/ A&P   {   Click here for ABCD2, HEART and other calculatorsREFRESH  Note before signing :1}                              Medical Decision Making Amount and/or Complexity of Data Reviewed Labs: ordered.  Risk Prescription drug management.   ***  {Document critical care time when appropriate:1} {Document review of labs and clinical decision tools ie heart score, Chads2Vasc2 etc:1}  {Document your independent review of radiology images, and any outside records:1} {Document your discussion with family members, caretakers, and with consultants:1} {Document social determinants of health affecting pt's care:1} {Document your decision making why or why not admission, treatments were needed:1} Final Clinical Impression(s) / ED Diagnoses Final diagnoses:  Encounter for psychiatric assessment  Involuntary commitment    Rx / DC Orders ED Discharge Orders     None

## 2023-12-05 NOTE — ED Notes (Signed)
Pt starting to relax and lay down.

## 2023-12-05 NOTE — ED Notes (Signed)
Mother is requesting she be contacted prior to pt being placed in facility, mother requesting pt stay within [place]

## 2023-12-05 NOTE — Consult Note (Signed)
[facility] Psychiatric Consult Follow-up  Patient Name: .Jacob [name]  MRN: [id number]  DOB: May 11, 1996  Consult Order details:  Orders (From admission, onward)     Start     Ordered   12/05/23 1024  CONSULT TO CALL ACT TEAM       Ordering Provider: Al Decant, PA-C  Provider:  (Not yet assigned)  Question:  Reason for Consult?  Answer:  Psych consult   12/05/23 1023             Mode of Visit: In person    Psychiatry Consult Evaluation  Service Date: December 05, 2023 LOS:  LOS: 0 days  Chief Complaint acute psychosis  Primary Psychiatric Diagnoses  Major Depressive Disorder with psychosis 2.   GAD  Assessment  Jacob [name] is a 28 y.o. male admitted: Presented to the ED for 12/05/2023 10:16 AM for acute psychosis. He carries the psychiatric diagnoses of major depressive disorder with psychosis and has a past medical history of none.   His current presentation of psychosis is most consistent with depression with psychosis. He meets criteria for inpatient based on psychosis.  Current outpatient psychotropic medications include propranolol, Seroquel, trazodone, and Atarax, and Paxil and historically he has had a positive response to these medications. He was possibly non compliant with medications prior to admission as evidenced by mother. On initial examination, patient pretends to be asleep, looks at provider and closes his eyes and does not speak. Please see plan below for detailed recommendations.   Diagnoses:  Active Hospital problems: Principal Problem:   MDD (major depressive disorder), recurrent, severe, with psychosis (HCC)    Plan   ## Psychiatric Medication Recommendations:  Continue patient's propranolol 10 mg twice daily for anxiety Continue Seroquel 200 mg at bedtime for mood Continue trazodone 50 mg at bedtime for sleep Continue Atarax 3 times daily as needed for anxiety Continue Paxil 20 mg p.o. daily for depression  ## Medical Decision  Making Capacity:  Patient is his own legal guardian  ## Further Work-up:  -- UDS needed EKG, While pt on Qtc prolonging medications, please monitor & replete K+ to 4 and Mg2+ to 2, or UDS -- most recent EKG on 12/05/23 had QtC of 397 -- Pertinent labwork reviewed earlier this admission includes: CMP, UDS, EKG, BNP, LFTs  ## Disposition:-- We recommend inpatient psychiatric hospitalization after medical hospitalization. Patient has been involuntarily committed on 12/05/23.   ## Behavioral / Environmental: -Difficult Patient (SELECT OPTIONS FROM BELOW), To minimize splitting of staff, assign one staff person to communicate all information from the team when feasible., or Utilize compassion and acknowledge the patient's experiences while setting clear and realistic expectations for care.    ## Safety and Observation Level:  - Based on my clinical evaluation, I estimate the patient to be at high risk of self harm in the current setting. - At this time, we recommend  1:1 Observation. This decision is based on my review of the chart including patient's history and current presentation, interview of the patient, mental status examination, and consideration of suicide risk including evaluating suicidal ideation, plan, intent, suicidal or self-harm behaviors, risk factors, and protective factors. This judgment is based on our ability to directly address suicide risk, implement suicide prevention strategies, and develop a safety plan while the patient is in the clinical setting. Please contact our team if there is a concern that risk level has changed.  CSSR Risk Category:   Suicide Risk Assessment: Patient has following modifiable risk  factors for suicide: under treated depression , recklessness, and medication noncompliance, which we are addressing by . Patient has following non-modifiable or demographic risk factors for suicide: male gender, history of self harm behavior, and psychiatric  hospitalization Patient has the following protective factors against suicide: Supportive family  Thank you for this consult request. Recommendations have been communicated to the primary team.  We will recommend inpatient psychiatric admission and continue to follow patient at this time.   Jacob [name], PMHNP       History of Present Illness  Relevant Aspects of Hospital ED Course:  Admitted on 12/05/2023 for acute psychosis, possibly due to noncompliance with medications.  Patient Report:  Jacob [name], 28 y.o., male patient seen face to face by this provider, consulted with Dr. Jannifer Franklin; and chart reviewed on 12/05/23.  On evaluation Jacob [name] reports  During evaluation Jacob [name] is laying in bed, with blankets covering his head, with eyes closed, appears to be in in no acute distress.  Patient is alert, would not speak with provider, looked at provider and close his eyes.  This provider discussed with him in order to achieve a plan of care, informed him that he needed to speak with me, patient then looks from over his blanket at provider and covered his head back up. Unable to assess thought process and thought content, and orientation at this time.  Patient does not appear to be in any distress.  Per chart review patient has a history of major depressive disorder with psychosis, was [facility] this [facility] in 2024, and behavioral health Hospital in July 2024.   Psych ROS:  Depression: Unable to assess Anxiety: Unable to assess Mania (lifetime and current): Unable to assess Psychosis: (lifetime and current): Yes  Collateral information:  Spoke with patient's mother Jacob [name], she states that patient has not slept since Thursday, states that he has been up restless, confused and disorganized.  She states she is not sure if he has been compliant with his medications, as he administers his medications himself.  She states that she used to administer  his medications but at that he left [facility] in July 2024, he wanted to be more independent and responsible for his medications.  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  She states that he used to receive counseling, unable to recall where but states he does not receive counseling anymore.  He does see a psychiatrist at [facility], he has been seeing her since August 2024.  She states patient will eat meals on some days and skip other days, she rates his appetite as fair.  She does ask that patient stay in the [facility] health system, if possible, would not like for him to go to [facility], she states that he was in [place] earlier this year, she felt that it was a waste of time and money, she did not like the service that he received.  This provider informed her, we will attempt to reach out to [facility] health systems first, but is based on bed availability, and if [place] is full, we will have to send patient to the facility that accepts him.   Review of Systems  Psychiatric/Behavioral:  Positive for hallucinations and substance abuse. The patient has insomnia.      Psychiatric and Social History  Psychiatric History:  Information collected from patient's mother Jacob [name]  Prev Dx/Sx: Major depressive disorder with psychosis, GAD Current Psych Provider: [facility] with Dr. Janae Bridgeman Home Meds (current): See above Previous Med Trials: Yes Therapy: Not currently  Prior Psych Hospitalization: Yes Prior Self Harm: Yes Prior Violence: Mother denies  Family Psych History: Mother denies Family Hx suicide: Mother denies  Social History:  Developmental Hx: Patient is developmentally age-appropriate Educational Hx: Currently enrolled in college Occupational Hx: Unemployed Legal Hx: Denies Living Situation: Lives with parents Spiritual Hx: Catholic Access to weapons/lethal means: Denies  Substance  History Alcohol: Yes Type of alcohol mother does not know Last Drink unknown Number of drinks per day unknown History of alcohol withdrawal seizures denies History of DT's denies Tobacco: Yes Illicit drugs: Unknown Prescription drug abuse: Unknown Rehab hx: No  Exam Findings  Physical Exam:  Vital Signs:  Temp:  [97.4 F (36.3 C)] 97.4 F (36.3 C) (01/19 1408) Pulse Rate:  [78] 78 (01/19 1408) Resp:  [17-18] 18 (01/19 1408) BP: (122-134)/(77-84) 122/77 (01/19 1408) SpO2:  [95 %-99 %] 95 % (01/19 1408) Blood pressure 122/77, pulse 78, temperature (!) 97.4 F (36.3 C), temperature source Axillary, resp. rate 18, SpO2 95%. There is no height or weight on file to calculate BMI.  Physical Exam Vitals and nursing note reviewed. Exam conducted with a chaperone present.  Neurological:     Mental Status: He is alert.  Psychiatric:        Attention and Perception: He is inattentive.        Mood and Affect: Affect is inappropriate.        Speech: He is noncommunicative.        Behavior: Behavior is withdrawn.        Judgment: Judgment is inappropriate.     Mental Status Exam: General Appearance: Casual  Orientation:  Other:  Patient would not speak with provider  Memory:   Patient would not speak with provider  Concentration:  Attention Span: Poor  Recall:  Poor  Attention  Poor  Eye Contact:  Poor  Speech:  Blocked and patient would not speak with provider  Language:  Poor  Volume:   Patient would not speak with provider  Mood: Appeared to be in no distress  Affect:  NA  Thought Process:  NA  Thought Content:   Patient would not speak with provider  Suicidal Thoughts:   Patient will not speak with provider  Homicidal Thoughts:  No  Judgement:  Poor  Insight:  Lacking  Psychomotor Activity:  Normal  Akathisia:  No  Fund of Knowledge:  Poor      Assets:  Engineer, maintenance Social Support Vocational/Educational  Cognition: Unknown patient last week with  provider  ADL's: Unknown  AIMS (if indicated):        Other History   These have been pulled in through the EMR, reviewed, and updated if appropriate.  Family History:  The patient's family history is not on file.  Medical History: Past Medical History:  Diagnosis Date   Acute psychosis [facility])     Surgical History: No past surgical history on file.   Medications:   Current Facility-Administered Medications:    hydrOXYzine (ATARAX) tablet 25 mg, 25 mg, Oral, TID PRN, [name], Deklynn Charlet A, PMHNP   PARoxetine (PAXIL) tablet 20 mg, 20 mg, Oral, Daily, [name], Jennavecia Schwier A, PMHNP, 20 mg at 12/05/23 1725   propranolol (INDERAL) tablet 10 mg, 10 mg, Oral, BID, [name], Talin Rozeboom A, PMHNP   QUEtiapine (SEROQUEL XR) 24 hr tablet 200 mg, 200  mg, Oral, QHS, [name], Aaiden Depoy A, PMHNP   traZODone (DESYREL) tablet 50 mg, 50 mg, Oral, QHS PRN, [name], Mattea Seger A, PMHNP  Current Outpatient Medications:    nicotine (NICODERM CQ - DOSED IN MG/24 HOURS) 21 mg/24hr patch, Place 1 patch (21 mg total) onto the skin daily. (Patient taking differently: Place 21 mg onto the skin daily as needed (smoking cessation).), Disp: 28 patch, Rfl: 0   propranolol (INDERAL) 10 MG tablet, Take 1 tablet (10 mg total) by mouth 2 (two) times daily., Disp: 30 tablet, Rfl: 0   QUEtiapine (SEROQUEL XR) 200 MG 24 hr tablet, Take 1 tablet (200 mg total) by mouth at bedtime., Disp: 30 tablet, Rfl: 0   traZODone (DESYREL) 50 MG tablet, Take 1 tablet (50 mg total) by mouth at bedtime as needed for sleep., Disp: 30 tablet, Rfl: 0   hydrocortisone cream 1 %, Apply topically 2 (two) times daily. (Patient not taking: Reported on 12/05/2023), Disp: 30 g, Rfl: 0   hydrOXYzine (ATARAX) 25 MG tablet, Take 1 tablet (25 mg total) by mouth 3 (three) times daily as needed for anxiety. (Patient not taking: Reported on 12/05/2023), Disp: 30 tablet, Rfl: 0   PARoxetine (PAXIL) 20 MG tablet, Take 1 tablet (20 mg total) by  mouth daily. (Patient not taking: Reported on 12/05/2023), Disp: 30 tablet, Rfl: 0  Allergies: Allergies  Allergen Reactions   Depakote [Divalproex Sodium] Rash    Patient had reaction within 1 week of starting depakote developed red rash to trunk and bilateral upper extremities.    Latex Rash    Aahan Marques [name], PMHNP

## 2023-12-05 NOTE — ED Notes (Addendum)
Pt laying down resting at this moment, eyes open but laying down. Even chest rise and fall, shaking head
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
## 2023-12-05 NOTE — ED Provider Notes (Addendum)
Attempted to see patient, patient is currently asleep due to receiving Ativan 2 mg IM, and Haldol 5 mg IM at 1037 this morning.  Will attempt to see him again.  Spoke with patient's mother Amore Sukhram, she states that patient has not slept since Thursday, states that he has been up restless, confused and disorganized.  She states she is not sure if he has been compliant with his medications, as he administers his medications himself.  She states that she used to administer his medications but at that he left [facility] in July 2024, he wanted to be more independent and responsible for his medications.  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  She states that he used to receive counseling, unable to recall where but states he does not receive counseling anymore.  He does see a psychiatrist at [facility], he has been seeing her since August 2024.  She states patient will eat meals on some days and skip other days, she rates his appetite as fair.  She does ask that patient stay in the [facility] health system, if possible, would not like for him to go to [facility], she states that he was in [place] earlier this year, she felt that it was a waste of time and money, she did not like the service that he received.  This provider informed her, we will attempt to reach out to [facility] health systems first, but is based on bed availability, and if [place] is full, we will have to send patient to the facility that accepts him.
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
## 2023-12-05 NOTE — ED Notes (Signed)
Pt in bed eyes closed, even chest rise and fall.

## 2023-12-05 NOTE — Discharge Instructions (Signed)
Transferred to [facility]

## 2023-12-05 NOTE — ED Notes (Signed)
Pt is awake and wanting to leave, wanting to go out of the door. Pt wanting to talk to the doctor, advised him that the counselor attempted to talk to him but he was asleep and they will try back later.

## 2023-12-05 NOTE — ED Provider Notes (Addendum)
Behavioral Health Urgent Care Medical Screening Exam  Patient Name: Jacob [name] MRN: [id number] Date of Evaluation: 12/05/23 Chief Complaint:   Diagnosis:  Final diagnoses:  Acute psychosis (HCC)    History of Present illness: Jacob [name] is a 28 y.o. male.  Presents to [facility] urgent care accompanied by [facility] department.  Was reported that patient's hallucinating disorganized and actively psychotic.  Per [facility] department awaiting involuntary commitment orders.Patient observed attempting to get out of handcuffs, aggressive and unable to process questions appears to be responding to internal stimuli.   During evaluation Jacob [name] standing in sally port, handcuffed, patient  mood is incongruent with affect.  Patient is not speaking, staring blankly, psychosis, and paranoia as reported by [facility] department.   Jacob [name] charted history related to altered mental status, substance-induced psychosis, major depressive disorder with psychotic features.Discussed the need for patient to be transferred to local emergency department due to aggressive behavior and bed availability.  Spoke to MD Rosanna Randy and [facility] medical director regarding  [facility]) and patient being transferred.     Flowsheet Row ED from 12/05/2023 [facility] (Discharged) [facility] from [facility]/19/2023 [facility] at [facility]  C-SSRS RISK CATEGORY No Risk High Risk High Risk       Psychiatric Specialty Exam  Presentation  General Appearance:Bizarre  Eye Contact:Fleeting  Speech:Pressured  Speech Volume:Other (comment)  Handedness:Right   Mood and Affect  Mood: Dysphoric  Affect: Labile   Thought Process  Thought Processes: Disorganized  Descriptions of Associations:Loose  Orientation:None  Thought Content:Delusions; Paranoid Ideation   Diagnosis of Schizophrenia or Schizoaffective disorder in past: No  Duration of Psychotic Symptoms: N/A  Hallucinations:-- (N/A) Denies  Ideas of Reference:Paranoia  Suicidal Thoughts:No -- (Denies)  Homicidal Thoughts:No -- (Denies)   Sensorium  Memory: Immediate Fair; Recent Fair  Judgment: -- (N/A)  Insight: Lacking; Poor   Executive Functions  Concentration: -- (n/a)  Attention Span: Fair  Recall: [employer] of Knowledge: Fair  Language: Fair   Psychomotor Activity  Psychomotor Activity: Normal   Assets  Assets: Manufacturing systems engineer; Desire for Improvement; Housing; Physical Health; Resilience; Social Support   Sleep  Sleep: Good  Number of hours:  9.5   Physical Exam: Physical Exam Vitals and nursing note reviewed.  Cardiovascular:     Rate and Rhythm: Normal rate and regular rhythm.  Neurological:     Mental Status: He is disoriented.    Review of Systems  Psychiatric/Behavioral:  The patient is nervous/anxious.   All other systems reviewed and are negative.  Blood pressure 134/84, resp. rate 17, SpO2 99%. There is no height or weight on file to calculate BMI.  Musculoskeletal: Strength & Muscle Tone: within normal limits Gait & Station: unsteady Patient leans: N/A   [facility] MSE Discharge Disposition for Follow up and Recommendations: Based on my evaluation I certify that psychiatric inpatient services furnished can reasonably be expected to improve the patient's condition which I recommend transfer to an appropriate accepting facility.    Oneta Rack, NP 12/05/2023, 10:32 AM

## 2023-12-06 DIAGNOSIS — F333 Major depressive disorder, recurrent, severe with psychotic symptoms: Secondary | ICD-10-CM | POA: Diagnosis not present

## 2023-12-06 DIAGNOSIS — F411 Generalized anxiety disorder: Secondary | ICD-10-CM | POA: Diagnosis not present

## 2023-12-06 MED ORDER — LORAZEPAM 1 MG PO TABS
1.0000 mg | ORAL_TABLET | Freq: Three times a day (TID) | ORAL | Status: DC | PRN
Start: 2023-12-06 — End: 2023-12-09
  Administered 2023-12-06 – 2023-12-08 (×3): 1 mg via ORAL
  Filled 2023-12-06 (×3): qty 1

## 2023-12-06 MED ORDER — NICOTINE 21 MG/24HR TD PT24
21.0000 mg | MEDICATED_PATCH | Freq: Once | TRANSDERMAL | Status: DC
  Filled 2023-12-06: qty 1

## 2023-12-06 MED ORDER — LORAZEPAM 1 MG PO TABS: 1.0000 mg | ORAL_TABLET | Freq: Three times a day (TID) | ORAL | Status: DC | PRN

## 2023-12-06 MED ORDER — LORAZEPAM 2 MG/ML IJ SOLN
1.0000 mg | Freq: Three times a day (TID) | INTRAMUSCULAR | Status: DC | PRN
Start: 2023-12-06 — End: 2023-12-09
  Administered 2023-12-06 – 2023-12-08 (×2): 1 mg via INTRAMUSCULAR
  Filled 2023-12-06 (×2): qty 1

## 2023-12-06 MED ORDER — DIPHENHYDRAMINE HCL 25 MG PO CAPS: 25.0000 mg | ORAL_CAPSULE | Freq: Three times a day (TID) | ORAL | Status: DC | PRN

## 2023-12-06 MED ORDER — HALOPERIDOL 5 MG PO TABS: 5.0000 mg | ORAL_TABLET | Freq: Three times a day (TID) | ORAL | Status: DC | PRN

## 2023-12-06 MED ORDER — QUETIAPINE FUMARATE 50 MG PO TABS: 50.0000 mg | ORAL_TABLET | Freq: Every day | ORAL | Status: DC

## 2023-12-06 MED ORDER — OLANZAPINE 10 MG IM SOLR
10.0000 mg | Freq: Once | INTRAMUSCULAR | Status: AC
  Administered 2023-12-06: 10 mg via INTRAMUSCULAR
  Filled 2023-12-06: qty 10

## 2023-12-06 MED ORDER — LORAZEPAM 2 MG/ML IJ SOLN
1.0000 mg | Freq: Once | INTRAMUSCULAR | Status: AC
  Administered 2023-12-06: 1 mg via INTRAMUSCULAR
  Filled 2023-12-06: qty 1

## 2023-12-06 MED ORDER — HALOPERIDOL LACTATE 5 MG/ML IJ SOLN
5.0000 mg | Freq: Once | INTRAMUSCULAR | Status: AC
  Administered 2023-12-06: 5 mg via INTRAMUSCULAR
  Filled 2023-12-06: qty 1

## 2023-12-06 MED ORDER — HALOPERIDOL LACTATE 5 MG/ML IJ SOLN
5.0000 mg | Freq: Three times a day (TID) | INTRAMUSCULAR | Status: DC | PRN
Start: 2023-12-06 — End: 2023-12-09
  Administered 2023-12-06 – 2023-12-08 (×3): 5 mg via INTRAMUSCULAR
  Filled 2023-12-06 (×3): qty 1

## 2023-12-06 MED ORDER — DIPHENHYDRAMINE HCL 50 MG/ML IJ SOLN
25.0000 mg | Freq: Once | INTRAMUSCULAR | Status: AC
  Administered 2023-12-06: 25 mg via INTRAMUSCULAR
  Filled 2023-12-06: qty 1

## 2023-12-06 MED ORDER — DIPHENHYDRAMINE HCL 50 MG/ML IJ SOLN
25.0000 mg | Freq: Three times a day (TID) | INTRAMUSCULAR | Status: DC | PRN
Start: 2023-12-06 — End: 2023-12-09
  Administered 2023-12-06 – 2023-12-08 (×3): 25 mg via INTRAMUSCULAR
  Filled 2023-12-06 (×3): qty 1

## 2023-12-06 MED ORDER — LORAZEPAM 1 MG PO TABS
1.0000 mg | ORAL_TABLET | Freq: Once | ORAL | Status: AC
  Administered 2023-12-06: 1 mg via ORAL
  Filled 2023-12-06: qty 1

## 2023-12-06 MED ORDER — QUETIAPINE FUMARATE 25 MG PO TABS
25.0000 mg | ORAL_TABLET | Freq: Every day | ORAL | Status: DC
  Administered 2023-12-07: 25 mg via ORAL
  Filled 2023-12-06: qty 1

## 2023-12-06 NOTE — ED Notes (Signed)
Patient has dinner tray

## 2023-12-06 NOTE — ED Notes (Signed)
Patient in hallway attempting to aggravate the patient on the other side of the wall. Tried redirecting patient to his room. Patient sat with his tray in the hallway.

## 2023-12-06 NOTE — ED Notes (Signed)
Pt is still pulling at restraints. Is drowsy, but is fighting the medication.

## 2023-12-06 NOTE — ED Notes (Signed)
Patient asleep. Dinner tray at [employer].

## 2023-12-06 NOTE — ED Notes (Signed)
Patient is upset he is not allowed more phone calls. Patient had 2 phone calls during the allowed time. Patient was explained the rules and does not seem to want to follow rules. Patient has asked when he is leaving and says he doesn't understand why he can't leave.

## 2023-12-06 NOTE — Progress Notes (Signed)
LCSW Progress Note  [id number]   Jacob [name]  12/06/2023  11:29 PM    Inpatient Behavioral Health Placement  Pt meets inpatient criteria per Earney Navy, NP-PMHNP-BC. There are no available beds within [facility] system per [facility] Erica Wright,RN. Referral was sent to the following facilities;   Destination  Service Provider Address Phone Fax  [facility] [address] [place] [address] [phone]  [facility] [address] [place], [place] [address] [phone]  [facility] [address] [place] [address] [phone]  [facility] [address] [place] [address] [phone]  [facility] [address] [place] [address] [phone]  [facility] [address], [place] [address] [phone]  [facility] [address] [place] [address] [phone]  [facility] [address] [place] [address] [phone]  [facility] EFAX [address] [place], [place] [phone]  [facility] [address], [place] [address] [phone]  [facility] [address] [place] [address] [phone]  [facility] [address] [place], [place] [address] [phone]  [facility] Patient Placement [facility], [place] [phone]  [facility] [address] [place], [place] [address] [phone]  [facility] [address], [place] [address] [phone]  [facility] [address] [place] [address] [phone]  [facility] [address], [place] [address] [phone]  [facility] [address] [place] [address] [phone]  [facility] [place] [phone]  [facility] [address] [place] [address] [phone]  [facility] [address], [place] [address] [phone]  [facility] [address], [place] [address] [phone]  [facility] [address], [place] [address] [phone]  [facility] [address] [place] [address] [phone]  [facility] [place] [phone]  [facility] [address], [place] [address] [phone]  [facility] [address], [place] [address] [phone]  [facility] [address], [place] [address] [phone]  [facility] [address] [place], [place] [address] [phone]  [facility] [address] [place] [address] [phone]     Situation ongoing,  CSW will follow up.    Maryjean Ka, MSW, [facility] 12/06/2023 11:29 PM

## 2023-12-06 NOTE — Progress Notes (Signed)
LCSW Progress Note  [id number]   Jacob [name]  12/06/2023  10:19 AM  Description:   Inpatient Psychiatric Referral  Patient was recommended inpatient per Dahlia Byes NP. There are no available beds at [facility], per [facility] Rona Ravens RN. Patient was referred to the following out of network facilities:    Destination  Service Provider Address Phone Fax  [facility] [address] [place] [address] [phone]  [facility] [address] [place], [place] [address] [phone]  [facility] [address] [place] [address] [phone]  [facility] [address] [place] [address] [phone]  [facility] [address] [place] [address] [phone]  [facility] [address] [place] [address] [phone]  [facility] [address] [place] [address] [phone]  [facility] [address] [place] [address] [phone]  [facility] EFAX [address] [place], [place] [phone]  [facility] [address], [place] [address] [phone]  [facility] [address] [place] [address] [phone]      Situation ongoing, CSW to continue following and update chart as more information becomes available.      [name], MSW, LCSW  12/06/2023 10:19 AM

## 2023-12-06 NOTE — ED Notes (Signed)
Patient is still laying on bed will not initiate physical restraints at this time.

## 2023-12-06 NOTE — ED Provider Notes (Signed)
Emergency Medicine Observation Re-evaluation Note  Kashten Shuey is a 28 y.o. male, seen on rounds today.  Pt initially presented to the ED for complaints of No chief complaint on file. Currently, the patient is lying in the bed in restraints.  Overnight patient required numerous doses of antipsychotics as well as Ativan and Benadryl due to becoming agitated and aggressive after being told he cannot go outside and vape.  Patient is currently under IVC for concern for psychosis.  Physical Exam  BP 130/87 (BP Location: Left Arm)   Pulse 97   Temp 97.6 F (36.4 C) (Axillary)   Resp 20   Ht 6\' 2"  (1.88 m)   Wt 72.6 kg   SpO2 96%   BMI 20.54 kg/m  Physical Exam General: Awake slightly restless Cardiac: Regular rate Lungs: No respiratory distress Psych: Intermittently agitated  ED Course / MDM  EKG:EKG Interpretation Date/Time:  Sunday December 05 2023 11:59:00 EST Ventricular Rate:  68 PR Interval:  156 QRS Duration:  110 QT Interval:  374 QTC Calculation: 397 R Axis:   82  Text Interpretation: Normal sinus rhythm Normal ECG When compared with ECG of 19-May-2023 17:10, PREVIOUS ECG IS PRESENT Confirmed by Margarita Grizzle [phone]) on 12/05/2023 4:02:11 PM  I have reviewed the labs performed to date as well as medications administered while in observation.  Recent changes in the last 24 hours include numerous antipsychotics and sedated with medications which are only effective for several hours.  Plan  Current plan is for inpatient placement.    Gwyneth Sprout, MD 12/06/23 [phone]

## 2023-12-06 NOTE — Consult Note (Signed)
[facility] Psychiatric Consult Follow-up  Patient Name: .Jacob [name]  MRN: [id number]  DOB: [birth year]  Consult Order details:  Orders (From admission, onward)     Start     Ordered   12/05/23 1024  CONSULT TO CALL ACT TEAM       Ordering Provider: Al Decant, PA-C  Provider:  (Not yet assigned)  Question:  Reason for Consult?  Answer:  Psych consult   12/05/23 1023             Mode of Visit: In person    Psychiatry Consult Evaluation  Service Date: December 06, 2023 LOS:  LOS: 0 days  Chief Complaint acute psychosis  Primary Psychiatric Diagnoses  Major Depressive Disorder with psychosis 2.   GAD  Assessment  Jacob [name] is a 28 y.o. male admitted: Presented to the ED for 12/05/2023 10:16 AM for acute psychosis. He carries the psychiatric diagnoses of major depressive disorder with psychosis and has a past medical history of none.   His current presentation of psychosis is most consistent with depression with psychosis. He meets criteria for inpatient based on psychosis.  Current outpatient psychotropic medications include propranolol, Seroquel, trazodone, and Atarax, and Paxil and historically he has had a positive response to these medications. He was possibly non compliant with medications prior to admission as evidenced by mother. On initial examination, patient pretends to be asleep, looks at provider and closes his eyes and does not speak. Please see plan below for detailed recommendations.   Diagnoses:  Active Hospital problems: Principal Problem:   MDD (major depressive disorder), recurrent, severe, with psychosis (HCC)    Plan   ## Psychiatric Medication Recommendations:  Continue patient's propranolol 10 mg twice daily for anxiety Continue Seroquel 200 mg at bedtime for mood Continue trazodone 50 mg at bedtime for sleep Continue Atarax 3 times daily as needed for anxiety Continue Paxil 20 mg p.o. daily for depression  ## Medical Decision  Making Capacity:  Patient is his own legal guardian  ## Further Work-up:  -- UDS needed EKG, While pt on Qtc prolonging medications, please monitor & replete K+ to 4 and Mg2+ to 2, or UDS -- most recent EKG on 12/05/23 had QtC of 397 -- Pertinent labwork reviewed earlier this admission includes: CMP, UDS, EKG, BNP, LFTs-UDS is Negative.  ## Disposition:-- We recommend inpatient psychiatric hospitalization after medical hospitalization. Patient has been involuntarily committed on 12/05/23.   ## Behavioral / Environmental: -Difficult Patient (SELECT OPTIONS FROM BELOW), To minimize splitting of staff, assign one staff person to communicate all information from the team when feasible., or Utilize compassion and acknowledge the patient's experiences while setting clear and realistic expectations for care.    ## Safety and Observation Level:  - Based on my clinical evaluation, I estimate the patient to be at Moderate risk of self harm in the current setting. - At this time, we recommend  routine. This decision is based on my review of the chart including patient's history and current presentation, interview of the patient, mental status examination, and consideration of suicide risk including evaluating suicidal ideation, plan, intent, suicidal or self-harm behaviors, risk factors, and protective factors. This judgment is based on our ability to directly address suicide risk, implement suicide prevention strategies, and develop a safety plan while the patient is in the clinical setting. Please contact our team if there is a concern that risk level has changed.  CSSR Risk Category:C-SSRS RISK CATEGORY: No Risk  Suicide Risk Assessment: Patient  has following modifiable risk factors for suicide: under treated depression , recklessness, and medication noncompliance, which we are addressing by . Patient has following non-modifiable or demographic risk factors for suicide: male gender, history of self harm  behavior, and psychiatric hospitalization Patient has the following protective factors against suicide: Supportive family  Thank you for this consult request. Recommendations have been communicated to the primary team.  We will recommend inpatient psychiatric admission and continue to follow patient at this time.   Earney Navy, NP-PMHNP-BC       History of Present Illness  Relevant Aspects of Hospital ED Course:  Admitted on 12/05/2023 for acute psychosis, possibly due to noncompliance with medications.  Jacob [name], 28 y.o., male patient seen face to face by this provider,  and chart reviewed on 12/06/23.  On evaluation today patient was seen pacing, walking into other patient's rooms.  Patient is difficult to redirect.  Patient is seen argumentative, angry and agitated banging on doors, banging his  head on the wall and security staff stopped him from doing so.  Patient constantly asking to go home or call his mother.  Patient" anger increased when informed he cannot call mom until a certain time.  Patient was uncontrollable and was given Haldol, Ativan and Benadryl injection .  Patient finally stayed in his room for only an hour and forty five minutes and came out again pacing and going into rooms and dragging chair around the unit.Patient continues to require frequent redirecting and PRN Medications.  Patient continues to meet criteria for inpatient Psychiatry hospitalization and we will continue to fax out records to facilities with available bed.  He is eating and drinking fluid.  He did not answer question regarding his safety and security.  PRN for agitation in place for patient.  Psych ROS:  Depression: Unable to assess Anxiety: yes Mania (lifetime and current): Unable to assess Psychosis: (lifetime and current): Yes  Collateral information: na, already done yesterday  Review of Systems  Psychiatric/Behavioral:  Positive for hallucinations and substance abuse. The patient has  insomnia.      Psychiatric and Social History  Psychiatric History:  Information collected from patient's mother Jolyon Fischbeck  Prev Dx/Sx: Major depressive disorder with psychosis, GAD Current Psych Provider[facility] with Dr. Janae Bridgeman Home Meds (current): See above Previous Med Trials: Yes Therapy: Not currently  Prior Psych Hospitalization: Yes Prior Self Harm: Yes Prior Violence: Mother denies  Family Psych History: Mother denies Family Hx suicide: Mother denies  Social History:  Developmental Hx: Patient is developmentally age-appropriate Educational Hx: Currently enrolled in college Occupational Hx: Unemployed Legal Hx: Denies Living Situation: Lives with parents Spiritual Hx: Catholic Access to weapons/lethal means: Denies  Substance History Alcohol: Yes Type of alcohol mother does not know Last Drink unknown Number of drinks per day unknown History of alcohol withdrawal seizures denies History of DT's denies Tobacco: Yes Illicit drugs: Unknown Prescription drug abuse: Unknown Rehab hx: No  Exam Findings  Physical Exam:  Vital Signs:  Temp:  [97.6 F (36.4 C)-98 F (36.7 C)] 98 F (36.7 C) (01/20 1259) Pulse Rate:  [80-97] 80 (01/20 1259) Resp:  [20-80] 80 (01/20 1259) BP: (130-131)/(80-87) 131/80 (01/20 1259) SpO2:  [96 %-98 %] 98 % (01/20 1259) Weight:  [72.6 kg] 72.6 kg (01/19 1934) Blood pressure 131/80, pulse 80, temperature 98 F (36.7 C), temperature source Oral, resp. rate (!) 80, height 6\' 2"  (1.88 m), weight 72.6 kg, SpO2 98%. Body mass index is 20.54 kg/m.  Physical Exam Vitals and nursing note reviewed. Exam conducted with a chaperone present.  Neurological:     Mental Status: He is alert.  Psychiatric:        Attention and Perception: He is inattentive.        Mood and Affect: Mood is anxious. Affect is labile, angry and inappropriate.        Speech: Speech is delayed and tangential.        Behavior: Behavior is uncooperative,  agitated and aggressive.        Judgment: Judgment is impulsive and inappropriate.     Mental Status Exam: General Appearance: Casual  Orientation:  Other:  Patient would not speak with provider  Memory:  Unable to engage in meaningful conversation.  Concentration:  Attention Span: Poor  Recall:  Poor  Attention  Poor  Eye Contact:  Poor  Speech:  slow, monotone , mumbling answers   Language:  Poor  Volume:   Patient would not speak with provider  Mood: Angry, pacing around the unit, walking into peoples's rooms.  Affect:  NA  Thought Process:  NA  Thought Content:  Illogical and Tangential  Suicidal Thoughts:   Patient will not speak with provider /refused to answer this question.  Homicidal Thoughts:  No  Judgement:  Poor  Insight:  Lacking  Psychomotor Activity:  Increased and Restlessness  Akathisia:  No  Fund of Knowledge:  Poor      Assets:  Housing Social Support Vocational/Educational  Cognition: Unknown patient last week with provider  ADL's:  Patient requested shower and took shower today.  AIMS (if indicated):        Other History   These have been pulled in through the EMR, reviewed, and updated if appropriate.  Family History:  The patient's family history is not on file.  Medical History: Past Medical History:  Diagnosis Date  . Acute psychosis [facility])     Surgical History: History reviewed. No pertinent surgical history.   Medications:   Current Facility-Administered Medications:  .  diphenhydrAMINE (BENADRYL) capsule 25 mg, 25 mg, Oral, Q8H PRN **OR** diphenhydrAMINE (BENADRYL) injection 25 mg, 25 mg, Intramuscular, Q8H PRN, Davien Malone C, NP, 25 mg at 12/06/23 1149 .  haloperidol (HALDOL) tablet 5 mg, 5 mg, Oral, Q8H PRN **OR** haloperidol lactate (HALDOL) injection 5 mg, 5 mg, Intramuscular, Q8H PRN, Larrell Rapozo C, NP, 5 mg at 12/06/23 1149 .  LORazepam (ATIVAN) tablet 1 mg, 1 mg, Oral, Q8H PRN **OR** LORazepam (ATIVAN) injection 1  mg, 1 mg, Intramuscular, Q8H PRN, Mattalynn Crandle C, NP, 1 mg at 12/06/23 1148 .  nicotine (NICODERM CQ - dosed in mg/24 hours) patch 21 mg, 21 mg, Transdermal, Once, Palumbo, April, MD .  PARoxetine (PAXIL) tablet 20 mg, 20 mg, Oral, Daily, Motley-Mangrum, Jadeka A, PMHNP, 20 mg at 12/06/23 0924 .  propranolol (INDERAL) tablet 10 mg, 10 mg, Oral, BID, Motley-Mangrum, Jadeka A, PMHNP, 10 mg at 12/06/23 0923 .  QUEtiapine (SEROQUEL XR) 24 hr tablet 200 mg, 200 mg, Oral, QHS, Motley-Mangrum, Jadeka A, PMHNP, 200 mg at 12/05/23 2146 .  QUEtiapine (SEROQUEL) tablet 25 mg, 25 mg, Oral, Daily, Navika Hoopes C, NP .  traZODone (DESYREL) tablet 50 mg, 50 mg, Oral, QHS PRN, Motley-Mangrum, Jadeka A, PMHNP  Current Outpatient Medications:  .  nicotine (NICODERM CQ - DOSED IN MG/24 HOURS) 21 mg/24hr patch, Place 1 patch (21 mg total) onto the skin daily. (Patient taking differently: Place 21 mg onto the skin daily as needed (  smoking cessation).), Disp: 28 patch, Rfl: 0 .  propranolol (INDERAL) 10 MG tablet, Take 1 tablet (10 mg total) by mouth 2 (two) times daily., Disp: 30 tablet, Rfl: 0 .  QUEtiapine (SEROQUEL XR) 200 MG 24 hr tablet, Take 1 tablet (200 mg total) by mouth at bedtime., Disp: 30 tablet, Rfl: 0 .  traZODone (DESYREL) 50 MG tablet, Take 1 tablet (50 mg total) by mouth at bedtime as needed for sleep., Disp: 30 tablet, Rfl: 0 .  hydrocortisone cream 1 %, Apply topically 2 (two) times daily. (Patient not taking: Reported on 12/05/2023), Disp: 30 g, Rfl: 0 .  hydrOXYzine (ATARAX) 25 MG tablet, Take 1 tablet (25 mg total) by mouth 3 (three) times daily as needed for anxiety. (Patient not taking: Reported on 12/05/2023), Disp: 30 tablet, Rfl: 0 .  PARoxetine (PAXIL) 20 MG tablet, Take 1 tablet (20 mg total) by mouth daily. (Patient not taking: Reported on 12/05/2023), Disp: 30 tablet, Rfl: 0  Allergies: Allergies  Allergen Reactions  . Depakote [Divalproex Sodium] Rash    Patient had reaction  within 1 week of starting depakote developed red rash to trunk and bilateral upper extremities.   . Latex Rash    Earney Navy, NP-PMHNP-BC

## 2023-12-06 NOTE — Consult Note (Signed)
Attempt to engage this patient for assessment failed as he is pacing around in the unit after been moved from TCU to [facility] mumbling to himself" Not again, I don't want to do this again"  Efforts to get his attention failed.  Seroquel for am use and Ativan PRN ordered.  Provider will try later.

## 2023-12-06 NOTE — ED Notes (Signed)
Pt woke up went to the bathroom. Came to the door demanded his vape. Told him that it was 2 am and that it was time to go back to bed. Asked him if he wanted a nicotine patch he told me "h no", he then demanded to be released to go to his grandmothers house. Told him that was not possible. He then started shaking the bed, charging the door and the security and police came to keep him from running. He was then given medication to help stabilize his mood.

## 2023-12-06 NOTE — ED Notes (Signed)
Patient has not been in restraints prior to shift starting. Documentation was not completed prior to shift start. Made note on latest documentation for [place] Q2 Hour.

## 2023-12-06 NOTE — ED Notes (Signed)
Since being moved to this section, the patient has been attempting to open locked doors, trying to enter the rooms of other patients and has been very defiant when we try to redirect him. He comments saying "I will do whatever I want to do". He keeps walking up to one of the other patients even after being told to stay away from others because it makes the other patients uncomfortable

## 2023-12-06 NOTE — ED Notes (Signed)
Patient's restraints removed 

## 2023-12-07 DIAGNOSIS — F411 Generalized anxiety disorder: Secondary | ICD-10-CM

## 2023-12-07 DIAGNOSIS — F333 Major depressive disorder, recurrent, severe with psychotic symptoms: Secondary | ICD-10-CM

## 2023-12-07 MED ORDER — QUETIAPINE FUMARATE 100 MG PO TABS
200.0000 mg | ORAL_TABLET | Freq: Every day | ORAL | Status: DC
  Administered 2023-12-07 – 2023-12-08 (×2): 200 mg via ORAL
  Filled 2023-12-07 (×2): qty 2

## 2023-12-07 MED ORDER — NICOTINE 21 MG/24HR TD PT24
21.0000 mg | MEDICATED_PATCH | Freq: Once | TRANSDERMAL | Status: AC
  Administered 2023-12-07: 21 mg via TRANSDERMAL
  Filled 2023-12-07: qty 1

## 2023-12-07 MED ORDER — QUETIAPINE FUMARATE 100 MG PO TABS
100.0000 mg | ORAL_TABLET | Freq: Every day | ORAL | Status: DC
  Administered 2023-12-08 – 2023-12-09 (×2): 100 mg via ORAL
  Filled 2023-12-07 (×2): qty 1

## 2023-12-07 NOTE — Progress Notes (Signed)
LCSW Progress Note  [id number]   Jacob [name]  12/07/2023  12:12 PM  Description:   Inpatient Psychiatric Referral  Patient was recommended inpatient per Dahlia Byes NP. There are no available beds at [facility], per [facility] Rona Ravens RN. Patient was referred to the following out of network facilities:    Destination  Service Provider Address Phone Fax  [facility] [address] [place] [address] [phone]  [facility] [address] [place], [place] [address] [phone]  [facility] [address] [place] [address] [phone]  [facility] [address], [place] [address] [phone]  [facility] [address] [place] [address] [phone]  [facility] [address] [place] [address] [phone]  [facility] [address] [place] [address] [phone]  [facility] [address] [place] [address] [phone]  [facility] EFAX [address] [place], [place] [phone]  [facility] [address] [place] [address] [phone]  [facility] [address] [place] [address] [phone]  [facility] [address] [place], [place] [address] [phone]  [facility] Patient Placement [facility], [place] [phone]  [facility] [address] [place], [place] [address] [phone]  [facility] [address], [place] [address] [phone]  [facility] [address]  [place] [address] [phone]  [facility] [address] [place] [address] [phone]  [facility] [address], [place] [address] [phone]  [facility] [place] [phone]  [facility] [address] [place] [address] [phone]  [facility] [address], [place] [address] [phone]  [facility] [address] [place] [address] [phone]  [facility] [address], [place] [address] [phone]  [facility] [address], [place] [address] [phone]  [facility] [place] [phone]  [facility] [address], [place] [address] [phone]  [facility] [address] [place] [address] [phone]  [facility] [address] [place] [address] [phone]  [facility] [address] [place], [place] [address] [phone]  [facility] [address] [place] [address] [phone]     Situation ongoing, CSW to continue following and update chart as more information becomes available.      [place] Lev Cervone, MSW, LCSW  12/07/2023 12:12 PM

## 2023-12-07 NOTE — ED Notes (Signed)
Patient is standing at nurses station leaning against the glass with his eyes closed falling asleep. We have instructed patient to go lay down and sleep several times and patient refuses. Patient can not even hold eyes open to hold a conversation. Patient is not listening to staff.

## 2023-12-07 NOTE — ED Notes (Signed)
Pt's grandma visiting att

## 2023-12-07 NOTE — ED Notes (Signed)
Pt is up and using the telephone.

## 2023-12-07 NOTE — ED Notes (Signed)
Patient is pacing the halls complaining about wanting to go home. Patient is asking for phone calls and the patient has been told the rules regarding the phones several times during the shift.

## 2023-12-07 NOTE — ED Provider Notes (Signed)
Emergency Medicine Observation Re-evaluation Note  Jacob [name] is a 28 y.o. male, seen on rounds today.  Pt initially presented to the ED for complaints of No chief complaint on file. Currently, the patient is sleeping.  Physical Exam  BP 122/70 (BP Location: Right Arm)   Pulse 64   Temp 98 F (36.7 C) (Oral)   Resp 16   Ht 6\' 2"  (1.88 m)   Wt 72.6 kg   SpO2 99%   BMI 20.54 kg/m  Physical Exam General: No acute distress Lungs: No respiratory distress Psych: Sleeping  ED Course / MDM  EKG:EKG Interpretation Date/Time:  Monday December 06 2023 05:06:53 EST Ventricular Rate:  72 PR Interval:  164 QRS Duration:  92 QT Interval:  354 QTC Calculation: 387 R Axis:   75  Text Interpretation: Normal sinus rhythm Normal ECG When compared with ECG of 05-Dec-2023 11:59, No significant change was found Confirmed by Dione Booze ([id number]) on 12/07/2023 4:41:06 AM  I have reviewed the labs performed to date as well as medications administered while in observation.  Recent changes in the last 24 hours include the patient became agitated last night requiring as needed agitation medications with improvement he is currently sleeping.  Plan  Current plan is for psychiatric placement.    Durwin Glaze, MD 12/07/23 [phone]

## 2023-12-07 NOTE — ED Notes (Signed)
Patient is hitting his head on the walls. Patient has been asked several times to stop.

## 2023-12-07 NOTE — ED Notes (Signed)
Patient is awake and still asking for phone calls. Myself and the NT has told the patient it was 2 in the morning and phone calls can be made after 8am. Patient does not believe staff when told the time. He is upset that we wont give him phone calls at this time.

## 2023-12-07 NOTE — ED Notes (Signed)
Pt given breakfast tray

## 2023-12-07 NOTE — Consult Note (Signed)
[facility] Psychiatric Consult Follow-up  Patient Name: .Jacob [name]  MRN: [id number]  DOB: 1996-04-12  Consult Order details:  Orders (From admission, onward)     Start     Ordered   12/05/23 1024  CONSULT TO CALL ACT TEAM       Ordering Provider: Al Decant, PA-C  Provider:  (Not yet assigned)  Question:  Reason for Consult?  Answer:  Psych consult   12/05/23 1023             Mode of Visit: In person    Psychiatry Consult Evaluation  Service Date: December 07, 2023 LOS:  LOS: 0 days  Chief Complaint acute psychosis  Primary Psychiatric Diagnoses  Major Depressive Disorder with psychosis 2.   GAD  Assessment  Jacob [name] is a 28 y.o. male admitted: Presented to the ED for 12/05/2023 10:16 AM for acute psychosis. He carries the psychiatric diagnoses of major depressive disorder with psychosis and has a past medical history of none.   His current presentation of psychosis is most consistent with depression with psychosis. He meets criteria for inpatient based on psychosis.  Current outpatient psychotropic medications include propranolol, Seroquel, trazodone, and Atarax, and Paxil and historically he has had a positive response to these medications. He was possibly non compliant with medications prior to admission as evidenced by mother. On initial examination, patient pretends to be asleep, looks at provider and closes his eyes and does not speak. Please see plan below for detailed recommendations.   Diagnoses:  Active Hospital problems: Principal Problem:   MDD (major depressive disorder), recurrent, severe, with psychosis (HCC)    Plan   ## Psychiatric Medication Recommendations:  Continue patient's propranolol 10 mg twice daily for anxiety Increase Seroquel to 100 mg  in am and 200 mg at bedtime for mood Continue trazodone 50 mg at bedtime for sleep Continue Atarax 3 times daily as needed for anxiety Continue Paxil 20 mg p.o. daily for depression  ##  Medical Decision Making Capacity:  Patient is his own legal guardian  ## Further Work-up:  -- UDS needed EKG, While pt on Qtc prolonging medications, please monitor & replete K+ to 4 and Mg2+ to 2, or UDS -- most recent EKG on 12/05/23 had QtC of 397 -- Pertinent labwork reviewed earlier this admission includes: CMP, UDS, EKG, BNP, LFTs-UDS is Negative.  ## Disposition:-- We recommend inpatient psychiatric hospitalization after medical hospitalization. Patient has been involuntarily committed on 12/05/23.   ## Behavioral / Environmental: -Difficult Patient (SELECT OPTIONS FROM BELOW), To minimize splitting of staff, assign one staff person to communicate all information from the team when feasible., or Utilize compassion and acknowledge the patient's experiences while setting clear and realistic expectations for care.    ## Safety and Observation Level:  - Based on my clinical evaluation, I estimate the patient to be at Moderate risk of self harm in the current setting. - At this time, we recommend  routine. This decision is based on my review of the chart including patient's history and current presentation, interview of the patient, mental status examination, and consideration of suicide risk including evaluating suicidal ideation, plan, intent, suicidal or self-harm behaviors, risk factors, and protective factors. This judgment is based on our ability to directly address suicide risk, implement suicide prevention strategies, and develop a safety plan while the patient is in the clinical setting. Please contact our team if there is a concern that risk level has changed.  CSSR Risk Category:C-SSRS RISK CATEGORY:  No Risk  Suicide Risk Assessment: Patient has following modifiable risk factors for suicide: under treated depression , recklessness, and medication noncompliance, which we are addressing by . Patient has following non-modifiable or demographic risk factors for suicide: male gender,  history of self harm behavior, and psychiatric hospitalization Patient has the following protective factors against suicide: Supportive family  Thank you for this consult request. Recommendations have been communicated to the primary team.  We will recommend inpatient psychiatric admission and continue to follow patient at this time.   Earney Navy, NP-PMHNP-BC       History of Present Illness  Relevant Aspects of Hospital ED Course:  Admitted on 12/05/2023 for acute psychosis, possibly due to noncompliance with medications.  Jacob [name], 28 y.o., male patient seen face to face by this provider,  and chart reviewed on 12/07/23.   Patient has had a calmer day today although the Mental health tech documented seeing patient purposeful band his head on the wall and was asked to stop doing so.  Patient showed some anger earlier because he is still in the hospital.  Patient states he does not belong in the hospital.  He is communicating with staff at this time.  He is easily redirectable and he makes eye contact with staff. He comes out for his needs and to make phone calls.  He still believes that he should not be admitted in the hospital.  Patient is however taking his medications as prescribed.  Patient willingly takes shower when he wants to.  Care plan was reviewed with DR Woodroe Mode, Psychiatrist and Medications reviewed as well.  We agreed on increasing Seroquel for mood management/stabilization since patient is allergic to Depakote.  Seroquel is increased.  He denies SI/HI/AVH. Psych ROS:  Depression: Unable to assess Anxiety: yes Mania (lifetime and current): Unable to assess Psychosis: (lifetime and current): Yes  Collateral information: na, already done yesterday  Review of Systems  Constitutional: Negative.   Eyes: Negative.   Respiratory: Negative.    Cardiovascular: Negative.   Gastrointestinal: Negative.   Genitourinary: Negative.   Musculoskeletal: Negative.   Skin:  Negative.   Neurological: Negative.   Endo/Heme/Allergies: Negative.   Psychiatric/Behavioral:  Positive for depression. The patient is nervous/anxious and has insomnia.      Psychiatric and Social History  Psychiatric History:  Information collected from patient's mother Gianna Spranger  Prev Dx/Sx: Major depressive disorder with psychosis, GAD Current Psych Provider: [facility] with Dr. Janae Bridgeman Home Meds (current): See above Previous Med Trials: Yes Therapy: Not currently  Prior Psych Hospitalization: Yes Prior Self Harm: Yes Prior Violence: Mother denies  Family Psych History: Mother denies Family Hx suicide: Mother denies  Social History:  Developmental Hx: Patient is developmentally age-appropriate Educational Hx: Currently enrolled in college Occupational Hx: Unemployed Legal Hx: Denies Living Situation: Lives with parents Spiritual Hx: Catholic Access to weapons/lethal means: Denies  Substance History Alcohol: Yes Type of alcohol mother does not know Last Drink unknown Number of drinks per day unknown History of alcohol withdrawal seizures denies History of DT's denies Tobacco: Yes Illicit drugs: Unknown Prescription drug abuse: Unknown Rehab hx: No  Exam Findings  Physical Exam:  Vital Signs:  Temp:  [98.3 F (36.8 C)] 98.3 F (36.8 C) (01/21 0909) Pulse Rate:  [64-99] 99 (01/21 0909) Resp:  [16-18] 18 (01/21 0909) BP: (118-122)/(70-87) 118/87 (01/21 0909) SpO2:  [99 %] 99 % (01/21 0909) Blood pressure 118/87, pulse 99, temperature 98.3 F (36.8 C), temperature source Oral, resp. rate  18, height 6\' 2"  (1.88 m), weight 72.6 kg, SpO2 99%. Body mass index is 20.54 kg/m.  Physical Exam Vitals and nursing note reviewed. Exam conducted with a chaperone present.  Neurological:     Mental Status: He is alert.  Psychiatric:        Attention and Perception: Attention normal.        Mood and Affect: Mood is not anxious. Affect is labile, angry and  inappropriate.        Speech: Speech is delayed and tangential.        Behavior: Behavior is cooperative.        Judgment: Judgment is impulsive.     Mental Status Exam: General Appearance: Casual  Orientation:  Full (Time, Place, and Person)  Memory:  Unable to engage in meaningful conversation.  Concentration:  Concentration: Good and Attention Span: Good  Recall:  Fair  Attention  Fair  Eye Contact:  Fair  Speech:  slow, monotone  Language:  Good  Volume:  Normal  Mood: calm, cooperative.  Affect:  NA  Thought Process:  NA  Thought Content:  Illogical and Tangential  Suicidal Thoughts:  No  Homicidal Thoughts:  No  Judgement:  Poor  Insight:  Lacking  Psychomotor Activity:  Normal  Akathisia:  No  Fund of Knowledge:  Poor      Assets:  Housing Social Support Vocational/Educational  Cognition: wnl  ADL's:  Patient requested shower and took shower today.  AIMS (if indicated):        Other History   These have been pulled in through the EMR, reviewed, and updated if appropriate.  Family History:  The patient's family history is not on file.  Medical History: Past Medical History:  Diagnosis Date  . Acute psychosis [facility])     Surgical History: History reviewed. No pertinent surgical history.   Medications:   Current Facility-Administered Medications:  .  diphenhydrAMINE (BENADRYL) capsule 25 mg, 25 mg, Oral, Q8H PRN **OR** diphenhydrAMINE (BENADRYL) injection 25 mg, 25 mg, Intramuscular, Q8H PRN, Sharalyn Lomba C, NP, 25 mg at 12/07/23 0655 .  haloperidol (HALDOL) tablet 5 mg, 5 mg, Oral, Q8H PRN **OR** haloperidol lactate (HALDOL) injection 5 mg, 5 mg, Intramuscular, Q8H PRN, Sherilynn Dieu C, NP, 5 mg at 12/07/23 0655 .  LORazepam (ATIVAN) tablet 1 mg, 1 mg, Oral, Q8H PRN, 1 mg at 12/06/23 2207 **OR** LORazepam (ATIVAN) injection 1 mg, 1 mg, Intramuscular, Q8H PRN, Kala Ambriz C, NP, 1 mg at 12/06/23 1148 .  nicotine (NICODERM CQ - dosed in  mg/24 hours) patch 21 mg, 21 mg, Transdermal, Once, Melene Plan, DO, 21 mg at 12/07/23 0608 .  PARoxetine (PAXIL) tablet 20 mg, 20 mg, Oral, Daily, Motley-Mangrum, Jadeka A, PMHNP, 20 mg at 12/07/23 1028 .  propranolol (INDERAL) tablet 10 mg, 10 mg, Oral, BID, Motley-Mangrum, Jadeka A, PMHNP, 10 mg at 12/07/23 1028 .  QUEtiapine (SEROQUEL XR) 24 hr tablet 200 mg, 200 mg, Oral, QHS, Motley-Mangrum, Jadeka A, PMHNP, 200 mg at 12/06/23 2207 .  [START ON 12/08/2023] QUEtiapine (SEROQUEL) tablet 100 mg, 100 mg, Oral, Daily, Jabree Pernice C, NP .  traZODone (DESYREL) tablet 50 mg, 50 mg, Oral, QHS PRN, Motley-Mangrum, Jadeka A, PMHNP  Current Outpatient Medications:  .  nicotine (NICODERM CQ - DOSED IN MG/24 HOURS) 21 mg/24hr patch, Place 1 patch (21 mg total) onto the skin daily. (Patient taking differently: Place 21 mg onto the skin daily as needed (smoking cessation).), Disp: 28 patch, Rfl: 0 .  propranolol (INDERAL) 10 MG tablet, Take 1 tablet (10 mg total) by mouth 2 (two) times daily., Disp: 30 tablet, Rfl: 0 .  QUEtiapine (SEROQUEL XR) 200 MG 24 hr tablet, Take 1 tablet (200 mg total) by mouth at bedtime., Disp: 30 tablet, Rfl: 0 .  traZODone (DESYREL) 50 MG tablet, Take 1 tablet (50 mg total) by mouth at bedtime as needed for sleep., Disp: 30 tablet, Rfl: 0 .  hydrocortisone cream 1 %, Apply topically 2 (two) times daily. (Patient not taking: Reported on 12/05/2023), Disp: 30 g, Rfl: 0 .  hydrOXYzine (ATARAX) 25 MG tablet, Take 1 tablet (25 mg total) by mouth 3 (three) times daily as needed for anxiety. (Patient not taking: Reported on 12/05/2023), Disp: 30 tablet, Rfl: 0 .  PARoxetine (PAXIL) 20 MG tablet, Take 1 tablet (20 mg total) by mouth daily. (Patient not taking: Reported on 12/05/2023), Disp: 30 tablet, Rfl: 0  Allergies: Allergies  Allergen Reactions  . Depakote [Divalproex Sodium] Rash    Patient had reaction within 1 week of starting depakote developed red rash to trunk and bilateral  upper extremities.   . Latex Rash    Earney Navy, NP-PMHNP-BC

## 2023-12-07 NOTE — ED Notes (Signed)
Pt up this am, asking to make a phone call. Pt informed phone calls are allowed at 10am

## 2023-12-07 NOTE — ED Notes (Signed)
Patient was given new pair of socks.

## 2023-12-08 MED ORDER — NICOTINE 14 MG/24HR TD PT24
14.0000 mg | MEDICATED_PATCH | Freq: Once | TRANSDERMAL | Status: AC
  Administered 2023-12-08: 14 mg via TRANSDERMAL
  Filled 2023-12-08: qty 1

## 2023-12-08 MED ORDER — TRAZODONE HCL 50 MG PO TABS
50.0000 mg | ORAL_TABLET | Freq: Once | ORAL | Status: AC
  Administered 2023-12-08: 50 mg via ORAL
  Filled 2023-12-08: qty 1

## 2023-12-08 MED ORDER — LORAZEPAM 1 MG PO TABS
1.0000 mg | ORAL_TABLET | Freq: Once | ORAL | Status: AC
Start: 2023-12-08 — End: 2023-12-08
  Administered 2023-12-08: 1 mg via ORAL
  Filled 2023-12-08: qty 1

## 2023-12-08 NOTE — ED Notes (Signed)
Pt had a mom's visit for 30 min. Pt became mad and aggitated when his mother left stating he wanted to leave with her.

## 2023-12-08 NOTE — Progress Notes (Signed)
LCSW Progress Note  [id number]   Jacob [name]  12/08/2023  10:48 AM  Description:   Inpatient Psychiatric Referral  Patient was recommended inpatient per Phebe Colla NP. There are no available beds at [facility], per [facility] Rona Ravens RN. Patient was referred to the following out of network facilities:    Destination  Service Provider Request Status Services Address Phone Fax Patient Preferred  [facility] Pending - Request Sent -- [address] [place] [address] [phone] --  [facility] Pending - Request Sent -- [address] [place], [place] [address] [phone] --  [facility] Pending - Request Sent -- [address] [place] [address] [phone] --  [facility] Pending - Request Sent -- [address] [place] [address] [phone] --  [facility] Pending - Request Sent -- [address], [place] [address] [phone] --  [facility] Pending - Request Sent -- [address] [place] [address] [phone] --  [facility] Pending - Request Sent -- [address] [place] [address] [phone] --  [facility] Pending - Request Sent -- [name] [place] [address] [phone] --  [facility] Pending - Request Sent -- [address] [name] [place] [phone] --  [facility] Pending - Request Sent -- [address], [place] [address] [phone] --  [facility] Pending - Request Sent -- [address] [place] [address] [phone] --  [facility] Pending - Request Sent -- [address] [place], [place] [address] [phone] --  [facility] Patient Placement Pending - Request Sent -- [facility], [place] [phone] --  [facility] Pending - Request Sent -- [address] [place], [place] [address] [phone] --  [facility] Pending - Request Sent -- [address], [place] [address] [phone] --  [facility] Pending - Request Sent -- [address], [place] [address] [phone] --  [facility] Pending - Request Sent -- [address] [place] [address] [phone] --  [facility] Pending - Request Sent -- [address], [place] [address] [phone] --  [facility]al Health Pending - Request Sent -- [place] [phone] --  [facility] Pending - Request Sent -- [address] [place] [address] [phone] --  [facility] Pending - Request Sent -- [address] [place] [address] [phone] --  [facility] Pending - Request Sent -- [address] Rachelle Hora [place] [address] [phone] --  [facility] Pending - Request Sent -- [address] [place] [address] [phone] --  [facility] Pending - Request Sent -- [address] [place] [address] [phone] --  [facility] Inpatient [facility] Pending - Request Sent -- [place] [phone] --  [facility] Pending - Request Sent -- [address], [place] [address] [phone] --  [facility] Pending - Request Sent -- [address] [place], [place] [address] [phone] --   [facility] Pending - Request Sent -- [address] [place] [address] [phone] --  [facility] Pending - Request Sent -- [address] [place], [place] [address] [phone] --  [facility] Pending - Request Sent -- [address], [place] [address] [phone]       Situation ongoing, CSW to continue following and update chart as more information becomes available.     Jacob [name], MSW, LCSW  12/08/2023 10:48 AM

## 2023-12-08 NOTE — ED Notes (Signed)
Patient came to nurses station and asked if he could have something for sleep. I instructed patient I gave him meds earlier for same but I would be glad to message doctor for something else. Patient said yes. I Sherilyn Cooter MD and she placed medications in for patient.

## 2023-12-08 NOTE — ED Notes (Addendum)
Patient became agitated when mom was visiting and leaving. Patient wanted to leave. Uncooperative with redirection.  Patient trying to leave, banging on door.  Patient went into another patients room.  PRNs given (see MAR)

## 2023-12-08 NOTE — ED Notes (Signed)
Pt ate breakfast at 0900.

## 2023-12-08 NOTE — Progress Notes (Signed)
LCSW Progress Note  [id number]   Pratyush Alvi  12/08/2023  12:53 AM    Inpatient Behavioral Health Placement  Pt meets inpatient criteria per Earney Navy, NP-PMHNP-BC. There are no available beds [facility] per [facility] Rona Ravens, RN. Referral was sent to the following facilities;   Destination  Service Provider Address Phone Fax  [facility] [address] [place] [address] [phone]  [facility]-Adult [address] [place], [place] [address] [phone]  [facility] [address] [place] [address] [phone]  [facility] [address], [place] [address] [phone]  [facility] [address] [place] [address] [phone]  [facility] [address] [place] [address] [phone]  [facility] [address] [place] [address] [phone]  [facility] [address], [place] [address] [phone]  [facility] EFAX [address] [place], [place] [phone]  [facility] [address] [place] [address] [phone]  [facility] [address] [place] [address] [phone]  [facility] [address] [place], [place] [address] [phone]  [facility] Patient Placement [facility], [place] [phone]  [facility] [address] [place], [place] [address] [phone]  [facility] [address] [place] [address] [phone]  [facility] [address] [place] [address] [phone]  [facility] [address], [place] [address] [phone]  [facility] [address], [place] [address] [phone]  [facility]ED [facility]al [facility] [place] [phone]  [facility] [address] [place] [address] [phone]  [facility] [address] [place] [address] [phone]  [facility] [address], [place] [address] [phone]  [facility] [address] [place], [place] [address] [phone]  [facility] [address] [place] [address] [phone]  [facility] Psychiatry Inpatient [facility] [place] [phone]  [facility] [address], [place] [address] [phone]  [facility] [address] [place] [address] [phone]  [facility] Healthcare [address], [place] [address] [phone]  [facility] [address] [place], [place] [address] [phone]  [facility] [address] [place] [address] [phone]    Situation ongoing,  CSW will follow up.    Maryjean Ka, MSW, [facility] 12/08/2023 12:53 AM

## 2023-12-08 NOTE — ED Notes (Signed)
Pt received lunch tray 

## 2023-12-08 NOTE — ED Notes (Signed)
Pt ate a biscuit and water

## 2023-12-08 NOTE — ED Notes (Signed)
Pt watched and agitated when he watched another pt's aggressive behavior through the window. Now he calmed down as the other patient was not in his sight. Pt made a phone call to his mother and he went to bed.

## 2023-12-09 MED ORDER — TRAZODONE HCL 50 MG PO TABS: 50.0000 mg | ORAL_TABLET | Freq: Every evening | ORAL | 0 refills | Status: AC | PRN

## 2023-12-09 MED ORDER — QUETIAPINE FUMARATE 200 MG PO TABS: 200.0000 mg | ORAL_TABLET | Freq: Every day | ORAL | 0 refills | Status: AC

## 2023-12-09 MED ORDER — QUETIAPINE FUMARATE 100 MG PO TABS: 100.0000 mg | ORAL_TABLET | Freq: Every day | ORAL | 0 refills | Status: AC

## 2023-12-09 NOTE — ED Notes (Signed)
Patient talked on the phone with mother and then afterwards began yelling through the door hole through the outside to people on the other side. Then patient began knocking and shaking the doctors door trying to get her to open it.

## 2023-12-09 NOTE — ED Notes (Signed)
Pt requested nicotine patch multiple times. Pt made aware that nicotine patch had not been ordered yet. ED MD made aware of same

## 2023-12-09 NOTE — Progress Notes (Signed)
LCSW Progress Note  [id number]   Peterson Bogie  12/09/2023  2:29 AM    Inpatient Behavioral Health Placement  Pt meets inpatient criteria per Earney Navy, NP-PMHNP-BC . There are no available beds [facility] system per [facility] Rona Ravens, RN. Referral was sent to the following facilities;   Destination  Service Provider Address Phone Fax  [facility] [address] [place] [address] [phone]  [facility] [address] [place], [place] [address] [phone]  [facility] [address] [place] [place] [address] [phone]  [facility] [address] [place] [address] [phone]  [facility] [address] [place] [address] [phone]  [facility] [address], [place] [address] [phone]  [facility] [address] [place] [address] [phone]  [facility] EFAX [address] [place] [phone]  [facility] [address] [place] [address] [phone]  [facility] [address] [place] [address] [phone]  [facility] [address] [place], [place] [address] [phone]  [facility] Patient Placement [facility], [place] [phone]  [facility] [address] [place], [place] [address] [phone]  [facility] [address], [place] [address] [phone]  [facility] [address] [place] [address] [phone]  [facility] [address] [place]  [address] [phone]  [facility] [address] [place] [address] [phone]  [facility] BED Management Behavioral Health [place] [phone]  [facility] [address] [place] [address] [phone]  [facility] [address], [place] [address] [phone]  [facility] [address], [place] [address] [phone]  [facility] [address] [place], [place] [address] [phone]  [facility] [address] [place] [address] [phone]  [facility] Psychiatry Inpatient [facility] [place] [phone]  [facility] [address] [place] [address] [phone]  [facility] [address] [place] [address] [phone]  [facility] [address] [place] [address] [phone]  [facility] [address] [place], [place] [address] [phone]  [facility] [address] [place] [address] [phone]    Situation ongoing,  CSW will follow up.    Maryjean Ka, MSW, LCSWA 12/09/2023 2:29 AM

## 2023-12-09 NOTE — ED Notes (Signed)
Patients  grandmother has come to visit

## 2023-12-09 NOTE — ED Provider Notes (Signed)
  Physical Exam  BP (!) 151/95 (BP Location: Right Arm)   Pulse 78   Temp 97.9 F (36.6 C) (Oral)   Resp 19   Ht 6\' 2"  (1.88 m)   Wt 72.6 kg   SpO2 98%   BMI 20.54 kg/m   Physical Exam  Procedures  Procedures  ED Course / MDM    Medical Decision Making I was notified by nursing and Jamesetta So NP, that patient is psych cleared. IVC rescinded by psych. Stable for discharge   Problems Addressed: Encounter for psychiatric assessment: acute illness or injury  Amount and/or Complexity of Data Reviewed Labs: ordered. Decision-making details documented in ED Course.  Risk Prescription drug management.          Charlynne Pander, MD 12/09/23 [phone]

## 2023-12-09 NOTE — ED Notes (Signed)
Patient gave verbal consent for medical staff to speak with his mother about all care and the plan

## 2023-12-09 NOTE — ED Notes (Signed)
Pt's mother, Jaye Fragoza, called for an update on pt. Advised that oncoming medic was currently getting report but once she gets settled, I can get her a message to call.   Creola Corn [phone]

## 2023-12-09 NOTE — ED Notes (Addendum)
Patients mother called and asked for a patient update. I was informed by paramedic that patient stated that he only wanted information given to his dad. So I informed his mother of this information and she said ok she understands.

## 2023-12-09 NOTE — ED Notes (Addendum)
Pt provided with toothbrush, toothpaste, 2 washcloths and a new pair of scrubs and socks at his request. Pt then took a shower.

## 2023-12-09 NOTE — ED Provider Notes (Signed)
Emergency Medicine Observation Re-evaluation Note  Zackariah Rupard is a 28 y.o. male, seen on rounds today.  Pt initially presented to the ED for complaints of psychosis. Pt pending placement. Pt resting, no new c/o this AM.  Physical Exam  BP 104/83 (BP Location: Left Arm)   Pulse 89   Temp 97.6 F (36.4 C) (Oral)   Resp 14   Ht 1.88 m (6\' 2" )   Wt 72.6 kg   SpO2 100%   BMI 20.54 kg/m  Physical Exam General: resting.  Cardiac: regular rate Lungs: breathing comfortably Psych: calm, resting.   ED Course / MDM    I have reviewed the labs performed to date as well as medications administered while in observation.  Recent changes in the last 24 hours include ED obs, med management, reassessment.   Plan  Pt pending bh placement. Further ED care and dispo per [facility] team.     Cathren Laine, MD 12/09/23 [phone]

## 2023-12-09 NOTE — Consult Note (Addendum)
Jacob [name] Psychiatric Consult Follow-up  Patient Name: .Jacob [name]  MRN: [id number]  DOB: 03/04/1996  Consult Order details:  Orders (From admission, onward)     Start     Ordered   12/05/23 1024  CONSULT TO CALL ACT TEAM       Ordering Provider: Al Decant, PA-C  Provider:  (Not yet assigned)  Question:  Reason for Consult?  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 male admitted: Presented to the ED for acute psychosis. He carries the psychiatric diagnoses of major depressive disorder with psychosis and has a past medical history of none.    Patient is at baseline and denies SI, HI or AVH. No evidence of mania or psychosis. Patient's grandmother was present and noted concern that patient has been abusing cocaine and his nicotine vape and not sleeping for days with symptoms of psychosis developing. Patient has been sleeping for several days and is at his baseline.  Had extensive discussion on limiting any substance abuse as patient's symptoms likely secondary to substance induced psychosis. He no longer meets inpatient psychiatric criteria and I discussed with patient and grandmother importance of medication compliance.  Current outpatient psychotropic medications include propranolol, Seroquel, trazodone, and Atarax, and Paxil and historically he has had a positive response to these medications. He was possibly non compliant with medications prior to admission as evidenced by mother, in the future if patient is noncompliant will consider starting on LAI such as Abilify or Risperdal.    Diagnoses:  Active Hospital problems: Principal  Problem:   MDD (major depressive disorder), recurrent, severe, with psychosis (HCC)    Plan   ## Psychiatric Medication Recommendations:  Continue patient's propranolol 10 mg twice daily for anxiety Continue Seroquel 100 mg  in am and 200 mg at bedtime for mood Continue trazodone 50 mg at bedtime for sleep Continue Atarax 3 times daily as needed for anxiety Continue Paxil 20 mg p.o. daily for depression   ## Medical Decision Making Capacity:  Patient is his own legal guardian  ## Further Work-up:  -- No further work up needed at this time EKG, U/A, or UDS -- most recent EKG on 12/05/23 had QtC of 397 -- Pertinent labwork reviewed earlier this admission includes: CMP, UDS, EKG, BNP, LFTs-UDS is Negative.    ## Disposition:-- Plan Post Discharge/Psychiatric Care Follow-up resources patient is psychiatrically cleared.  Refilled patient's psychiatric medications with a 30-day supply and sent to the patient's pharmacy. Which include his propranolol 10 mg twice daily for anxiety, Seroquel 100 mg  in am and 200 mg at bedtime for mood, trazodone 50 mg at bedtime for sleep, Atarax 3 times daily as needed for anxiety, and paxil 20 mg p.o. daily for depression Per patient's mother Jacob [name], patient was picked up by her for his grandmother Jacob [name].  ## Behavioral / Environmental: -To minimize splitting of staff, assign one staff person to communicate all information from the team when feasible. or Utilize compassion and acknowledge the patient's experiences while setting clear and realistic expectations for care.    ## Safety and  Observation Level:  - Based on my clinical evaluation, I estimate the patient to be at no risk of self harm in the current setting. - At this time, we recommend  routine. This decision is based on my review of the chart including patient's history and current presentation, interview of the patient, mental status examination, and consideration of suicide risk  including evaluating suicidal ideation, plan, intent, suicidal or self-harm behaviors, risk factors, and protective factors. This judgment is based on our ability to directly address suicide risk, implement suicide prevention strategies, and develop a safety plan while the patient is in the clinical setting. Please contact our team if there is a concern that risk level has changed.  CSSR Risk Category:C-SSRS RISK CATEGORY: No Risk  Suicide Risk Assessment: Patient has following modifiable risk factors for suicide: None, patient will be psychiatrically cleared. Patient has following non-modifiable or demographic risk factors for suicide: male gender and history of self harm behavior Patient has the following protective factors against suicide: This is aAccess to outpatient mental [name] care and Supportive family  Thank you for this consult request. Recommendations have been communicated to the primary team.  We will psychiatrically clear patient at this time.   Teliyah Royal MOTLEY-MANGRUM, PMHNP       History of Present Illness  Relevant Aspects of Hospital ED Course:  Admitted on 12/05/2023 for acute psychosis, possibly due to noncompliance with medications.    Jacob [name], 28 y.o., male patient seen face to face by this provider,  and chart reviewed on 12/09/23. On evaluation today, the patient is standing at the nurses station.  He is calm and cooperative during this assessment. His appearance is appropriate for environment. His eye contact is good.  Speech is clear and coherent, normal pace and normal volume. He is alert and oriented x4 to person, place, time, and situation. He reports his mood is "good".  Affect is congruent with mood.  Thought process is coherent.  Thought content is linear and goal directed.  She denies auditory and visual hallucinations.  No indication that he is responding to internal stimuli during this assessment.  No delusions elicited during this assessment. He denies  suicidal ideations. He denies homicidal ideations. Appetite and sleep are fair. Patient is however taking his medications as prescribed.  Patient willingly takes shower when he wants to. Care plan was reviewed with Dr. Woodroe Mode, Psychiatrist and Medications reviewed as well.     Psych ROS:  Depression: Patient denies Anxiety: yes  Mania (lifetime and current): Patient denies  Psychosis: (lifetime and current): Yes   Collateral information: Spoke with patient mother Jacob [name] and she stated that she is fine with patient returning home.  Discussed with mother safety planning to have the patient return home today, as he does not present currently as an imminent risk to himself or others at this time.  Mother reported she has no issues with the patient returning home, will come pick him up upon discharge. She also states she is setting up appointments with patient psychiatric provider Dr. May at [facility], she is also setting him up with therapy.      Review of Systems  Psychiatric/Behavioral: Negative.       Psychiatric and Social History  Psychiatric History:  Information collected from patient's mother Jacob [name]   Prev Dx/Sx: Major depressive disorder with psychosis, GAD Current Psych Provider: [facility] with Dr. Janae Bridgeman Home Meds (current): See above Previous Med Trials: Yes Therapy: Not currently  Prior Psych Hospitalization: Yes Prior Self Harm: Yes Prior Violence: Mother denies   Family Psych History: Mother denies Family Hx suicide: Mother denies   Social History:  Developmental Hx: Patient is developmentally age-appropriate Educational Hx: Currently enrolled in college Occupational Hx: Unemployed Legal Hx: Denies Living Situation: Lives with parents Spiritual Hx: Catholic Access to weapons/lethal means: Denies   Substance History Alcohol: Yes Type of alcohol mother does not know Last Drink unknown Number of drinks per day  unknown History of alcohol withdrawal seizures denies History of DT's denies Tobacco: Yes Illicit drugs: Unknown Prescription drug abuse: Unknown Rehab hx: No  Exam Findings  Physical Exam:  Vital Signs:  Temp:  [97.6 F (36.4 C)-97.9 F (36.6 C)] 97.9 F (36.6 C) (01/23 1146) Pulse Rate:  [78-92] 78 (01/23 1146) Resp:  [14-19] 19 (01/23 1146) BP: (104-151)/(76-95) 151/95 (01/23 1146) SpO2:  [96 %-100 %] 98 % (01/23 1146) Blood pressure (!) 151/95, pulse 78, temperature 97.9 F (36.6 C), temperature source Oral, resp. rate 19, height 6\' 2"  (1.88 m), weight 72.6 kg, SpO2 98%. Body mass index is 20.54 kg/m.  Physical Exam Vitals and nursing note reviewed. Exam conducted with a chaperone present.  Neurological:     Mental Status: He is alert.  Psychiatric:        Mood and Affect: Mood normal.        Behavior: Behavior normal.        Thought Content: Thought content normal.        Judgment: Judgment normal.     Mental Status Exam: General Appearance: Casual  Orientation:  Full (Time, Place, and Person)  Memory:  Immediate;   Fair Remote;   Good  Concentration:  Concentration: Good and Attention Span: Good  Recall:  Fair  Attention  Good  Eye Contact:  Good  Speech:  Clear and Coherent  Language:  Good  Volume:  Normal  Mood: euthymic  Affect:  Congruent  Thought Process:  Coherent  Thought Content:  WDL  Suicidal Thoughts:  No  Homicidal Thoughts:  No  Judgement:  Fair  Insight:  Fair  Psychomotor Activity:  Normal  Akathisia:  No  Fund of Knowledge:  Fair      Assets:  Manufacturing systems engineer Desire for Improvement Housing Social Support  Cognition:  WNL  ADL's:  Intact  AIMS (if indicated):        Other History   These have been pulled in through the EMR, reviewed, and updated if appropriate.  Family History:  The patient's family history is not on file.  Medical History: Past Medical History:  Diagnosis Date   Acute psychosis [facility])      Surgical History: History reviewed. No pertinent surgical history.   Medications:   Current Facility-Administered Medications:    diphenhydrAMINE (BENADRYL) capsule 25 mg, 25 mg, Oral, Q8H PRN **OR** diphenhydrAMINE (BENADRYL) injection 25 mg, 25 mg, Intramuscular, Q8H PRN, Onuoha, Josephine C, NP, 25 mg at 12/08/23 1024   haloperidol (HALDOL) tablet 5 mg, 5 mg, Oral, Q8H PRN **OR** haloperidol lactate (HALDOL) injection 5 mg, 5 mg, Intramuscular, Q8H PRN, Onuoha, Josephine C, NP, 5 mg at 12/08/23 1023   LORazepam (ATIVAN) tablet 1 mg, 1 mg, Oral, Q8H PRN, 1 mg at 12/08/23 2141 **OR** LORazepam (ATIVAN) injection 1 mg, 1 mg, Intramuscular, Q8H PRN, Onuoha, Josephine C, NP, 1 mg at 12/08/23 1024   nicotine (NICODERM CQ - dosed in mg/24 hours) patch 14 mg, 14 mg, Transdermal, Once, Edwin Dada P, DO, 14 mg  at 12/08/23 1311   PARoxetine (PAXIL) tablet 20 mg, 20 mg, Oral, Daily, Motley-Mangrum, Zaraya Delauder A, PMHNP, 20 mg at 12/09/23 0916   propranolol (INDERAL) tablet 10 mg, 10 mg, Oral, BID, Motley-Mangrum, Makinna Andy A, PMHNP, 10 mg at 12/09/23 0916   QUEtiapine (SEROQUEL) tablet 100 mg, 100 mg, Oral, Daily, Onuoha, Josephine C, NP, 100 mg at 12/09/23 0916   QUEtiapine (SEROQUEL) tablet 200 mg, 200 mg, Oral, QHS, Onuoha, Josephine C, NP, 200 mg at 12/08/23 2141   traZODone (DESYREL) tablet 50 mg, 50 mg, Oral, QHS PRN, Motley-Mangrum, Natalya Domzalski A, PMHNP, 50 mg at 12/08/23 2141  Current Outpatient Medications:    nicotine (NICODERM CQ - DOSED IN MG/24 HOURS) 21 mg/24hr patch, Place 1 patch (21 mg total) onto the skin daily. (Patient taking differently: Place 21 mg onto the skin daily as needed (smoking cessation).), Disp: 28 patch, Rfl: 0   propranolol (INDERAL) 10 MG tablet, Take 1 tablet (10 mg total) by mouth 2 (two) times daily., Disp: 30 tablet, Rfl: 0   QUEtiapine (SEROQUEL XR) 200 MG 24 hr tablet, Take 1 tablet (200 mg total) by mouth at bedtime., Disp: 30 tablet, Rfl: 0   traZODone (DESYREL) 50  MG tablet, Take 1 tablet (50 mg total) by mouth at bedtime as needed for sleep., Disp: 30 tablet, Rfl: 0   hydrocortisone cream 1 %, Apply topically 2 (two) times daily. (Patient not taking: Reported on 12/05/2023), Disp: 30 g, Rfl: 0   hydrOXYzine (ATARAX) 25 MG tablet, Take 1 tablet (25 mg total) by mouth 3 (three) times daily as needed for anxiety. (Patient not taking: Reported on 12/05/2023), Disp: 30 tablet, Rfl: 0   PARoxetine (PAXIL) 20 MG tablet, Take 1 tablet (20 mg total) by mouth daily. (Patient not taking: Reported on 12/05/2023), Disp: 30 tablet, Rfl: 0  Allergies: Allergies  Allergen Reactions   Depakote [Divalproex Sodium] Rash    Patient had reaction within 1 week of starting depakote developed red rash to trunk and bilateral upper extremities.    Latex Rash    Zen Cedillos MOTLEY-MANGRUM, PMHNP

## 2023-12-09 NOTE — ED Notes (Signed)
Pt provided with warm blanket. 

## 2023-12-09 NOTE — ED Notes (Signed)
Patient had a good night. Patient was compliant with medications and rested some. Patient was antsy beginning this morning with wanting to leave and asking about phone calls. Patient was easily redirectable. Patient currently laying in his room.

## 2023-12-09 NOTE — Discharge Instructions (Addendum)
Discharge recommendations:  Patient is to take medications as prescribed. Please see information for follow-up appointment with psychiatry and therapy. Please follow up with your primary care provider for all medical related needs.   Therapy: We recommend that patient participate in individual therapy to address mental health concerns.  Medications: The patient or guardian is to contact a medical professional and/or outpatient provider to address any new side effects that develop. The patient or guardian should update outpatient providers of any new medications and/or medication changes.   Atypical antipsychotics: If you are prescribed an atypical antipsychotic, it is recommended that your height, weight, BMI, blood pressure, fasting lipid panel, and fasting blood sugar be monitored by your outpatient providers.  Safety:  The patient should abstain from use of illicit substances/drugs and abuse of any medications. If symptoms worsen or do not continue to improve or if the patient becomes actively suicidal or homicidal then it is recommended that the patient return to the closest hospital emergency department, the [facility], or call 911 for further evaluation and treatment. National Suicide Prevention Lifeline 1-800-SUICIDE or 954 257 4646.  About 988 988 offers 24/7 access to trained crisis counselors who can help people experiencing mental health-related distress. People can call or text 988 or chat 988lifeline.org for themselves or if they are worried about a loved one who may need crisis support.  Crisis Mobile: [facility]:                     8634108252 (for crisis response 24 hours a day) [facility] Hotline:                                            986-245-6423   Safety Plan Jacob [name] will reach out to his mother Jacob [name], call 911 or call mobile crisis, or go to nearest emergency room if condition worsens or if  suicidal thoughts become active Patients' will follow up with [facility] for outpatient psychiatric services (therapy/medication management).  The suicide prevention education provided includes the following: Suicide risk factors Suicide prevention and interventions National Suicide Hotline telephone number [facility] assessment telephone number [facility] Emergency Assistance [address] and/or Residential Mobile Crisis Unit telephone number Request made of family/significant other to:  mother Jacob [name] Remove weapons (e.g., guns, rifles, knives), all items previously/currently identified as safety concern.   Remove drugs/medications (over the counter, prescriptions, illicit drugs), all items previously/currently identified as a safety concern.

## 2023-12-09 NOTE — ED Notes (Signed)
Patient took a shower.

## 2023-12-14 ENCOUNTER — Encounter (HOSPITAL_COMMUNITY): Payer: Self-pay

## 2023-12-14 ENCOUNTER — Emergency Department (HOSPITAL_COMMUNITY)
Admission: EM | Admit: 2023-12-14 | Discharge: 2023-12-15 | Disposition: A | Discharge status: Other Acute Inpatient Hospital | Payer: Commercial Managed Care - PPO | Attending: Emergency Medicine | Admitting: Emergency Medicine

## 2023-12-14 ENCOUNTER — Other Ambulatory Visit: Payer: Self-pay

## 2023-12-14 DIAGNOSIS — R451 Restlessness and agitation: Secondary | ICD-10-CM | POA: Insufficient documentation

## 2023-12-14 DIAGNOSIS — Z79899 Other long term (current) drug therapy: Secondary | ICD-10-CM | POA: Insufficient documentation

## 2023-12-14 DIAGNOSIS — Z91040 Latex allergy status: Secondary | ICD-10-CM | POA: Insufficient documentation

## 2023-12-14 LAB — COMPREHENSIVE METABOLIC PANEL
ALT: 16 U/L (ref 0–44)
AST: 17 U/L (ref 15–41)
Albumin: 4.6 g/dL (ref 3.5–5.0)
Alkaline Phosphatase: 49 U/L (ref 38–126)
Anion gap: 9 (ref 5–15)
BUN: 11 mg/dL (ref 6–20)
CO2: 26 mmol/L (ref 22–32)
Calcium: 9.6 mg/dL (ref 8.9–10.3)
Chloride: 105 mmol/L (ref 98–111)
Creatinine, Ser: 0.67 mg/dL (ref 0.61–1.24)
GFR, Estimated: >60 mL/min (ref >60)
Glucose, Bld: 92 mg/dL (ref 70–99)
Potassium: 4 mmol/L (ref 3.5–5.1)
Sodium: 140 mmol/L (ref 135–145)
Total Bilirubin: 0.5 mg/dL (ref 0.0–1.2)
Total Protein: 7.2 g/dL (ref 6.5–8.1)

## 2023-12-14 LAB — URINALYSIS, ROUTINE W REFLEX MICROSCOPIC
Bilirubin Urine: NEGATIVE
Glucose, UA: NEGATIVE mg/dL
Hgb urine dipstick: NEGATIVE
Ketones, ur: NEGATIVE mg/dL
Leukocytes,Ua: NEGATIVE
Nitrite: NEGATIVE
Protein, ur: NEGATIVE mg/dL
Specific Gravity, Urine: 1.019 (ref 1.005–1.030)
pH: 7 (ref 5.0–8.0)

## 2023-12-14 LAB — RAPID URINE DRUG SCREEN, HOSP PERFORMED
Amphetamines: NOT DETECTED
Barbiturates: NOT DETECTED
Benzodiazepines: NOT DETECTED
Cocaine: NOT DETECTED
Opiates: NOT DETECTED
Tetrahydrocannabinol: NOT DETECTED

## 2023-12-14 LAB — SALICYLATE LEVEL: Salicylate Lvl: <7 mg/dL — ABNORMAL LOW (ref 7.0–30.0)

## 2023-12-14 LAB — CBC WITH DIFFERENTIAL/PLATELET
Abs Immature Granulocytes: 0.03 10*3/uL (ref 0.00–0.07)
Basophils Absolute: 0 10*3/uL (ref 0.0–0.1)
Basophils Relative: 0 %
Eosinophils Absolute: 0.1 10*3/uL (ref 0.0–0.5)
Eosinophils Relative: 2 %
HCT: 46.5 % (ref 39.0–52.0)
Hemoglobin: 15.5 g/dL (ref 13.0–17.0)
Immature Granulocytes: 0 %
Lymphocytes Relative: 38 %
Lymphs Abs: 2.6 10*3/uL (ref 0.7–4.0)
MCH: 30.2 pg (ref 26.0–34.0)
MCHC: 33.3 g/dL (ref 30.0–36.0)
MCV: 90.5 fL (ref 80.0–100.0)
Monocytes Absolute: 0.8 10*3/uL (ref 0.1–1.0)
Monocytes Relative: 12 %
Neutro Abs: 3.4 10*3/uL (ref 1.7–7.7)
Neutrophils Relative %: 48 %
Platelets: 223 10*3/uL (ref 150–400)
RBC: 5.14 MIL/uL (ref 4.22–5.81)
RDW: 11.8 % (ref 11.5–15.5)
WBC: 7 10*3/uL (ref 4.0–10.5)
nRBC: 0 % (ref 0.0–0.2)

## 2023-12-14 LAB — ACETAMINOPHEN LEVEL: Acetaminophen (Tylenol), Serum: <10 ug/mL — ABNORMAL LOW (ref 10–30)

## 2023-12-14 LAB — ETHANOL: Alcohol, Ethyl (B): <10 mg/dL (ref <10)

## 2023-12-14 MED ORDER — ONDANSETRON HCL 4 MG PO TABS: 4.0000 mg | ORAL_TABLET | Freq: Three times a day (TID) | ORAL | Status: DC | PRN

## 2023-12-14 MED ORDER — HALOPERIDOL LACTATE 5 MG/ML IJ SOLN
5.0000 mg | Freq: Two times a day (BID) | INTRAMUSCULAR | Status: DC | PRN
  Administered 2023-12-14 – 2023-12-15 (×2): 5 mg via INTRAMUSCULAR
  Filled 2023-12-14 (×2): qty 1

## 2023-12-14 MED ORDER — QUETIAPINE FUMARATE 100 MG PO TABS
100.0000 mg | ORAL_TABLET | Freq: Every day | ORAL | Status: DC
  Administered 2023-12-14 – 2023-12-15 (×2): 100 mg via ORAL
  Filled 2023-12-14 (×2): qty 1

## 2023-12-14 MED ORDER — NICOTINE 21 MG/24HR TD PT24
21.0000 mg | MEDICATED_PATCH | Freq: Once | TRANSDERMAL | Status: DC
  Administered 2023-12-14: 21 mg via TRANSDERMAL
  Filled 2023-12-14: qty 1

## 2023-12-14 MED ORDER — TRAZODONE HCL 50 MG PO TABS
50.0000 mg | ORAL_TABLET | Freq: Every evening | ORAL | Status: DC | PRN
  Administered 2023-12-14: 50 mg via ORAL
  Filled 2023-12-14: qty 1

## 2023-12-14 MED ORDER — ACETAMINOPHEN 325 MG PO TABS: 650.0000 mg | ORAL_TABLET | ORAL | Status: DC | PRN

## 2023-12-14 MED ORDER — HALOPERIDOL 5 MG PO TABS
5.0000 mg | ORAL_TABLET | Freq: Two times a day (BID) | ORAL | Status: DC | PRN
  Administered 2023-12-14: 5 mg via ORAL
  Filled 2023-12-14: qty 1

## 2023-12-14 MED ORDER — DIPHENHYDRAMINE HCL 50 MG/ML IJ SOLN
25.0000 mg | Freq: Two times a day (BID) | INTRAMUSCULAR | Status: DC | PRN
  Administered 2023-12-14: 25 mg via INTRAMUSCULAR
  Filled 2023-12-14: qty 1

## 2023-12-14 MED ORDER — QUETIAPINE FUMARATE 100 MG PO TABS
200.0000 mg | ORAL_TABLET | Freq: Every day | ORAL | Status: DC
Start: 2023-12-14 — End: 2023-12-15
  Administered 2023-12-14: 200 mg via ORAL
  Filled 2023-12-14: qty 2

## 2023-12-14 NOTE — ED Triage Notes (Signed)
Pt BIB [facility] with IVC paperwork reports patient is having auditory hallucinations and punching walls.  Pt denies SI/HI. Patient was recently discharged 1 week ago for same.  Unk if compliant with meds

## 2023-12-14 NOTE — Progress Notes (Signed)
Pt was accepted to [facility] TOMORROW 12/14/2022; Bed Assignment [facility] Fax Number: [phone] (Adult)  Pt meets inpatient criteria per Vivien Presto  Attending Physician will be Dr. Loni Beckwith   Report can be called to:[phone]-Pager number, please leave a returned phone number to receive a phone call back.   Pt can arrive after 9:00am   Care Team notified: April Wilson, Paramedic, Darlin Priestly [place],LCSWA  Maryjean Ka, MSW, [facility] 12/14/2023 6:36 PM

## 2023-12-14 NOTE — ED Provider Notes (Signed)
[place] EMERGENCY DEPARTMENT AT [facility] Provider Note   CSN: [id number] Arrival date & time: 12/14/23  1050     History  Chief Complaint  Patient presents with   Psychiatric Evaluation    Jacob [name] is a 28 y.o. male.  He is brought in by police under an IVC that was taken out by his mother.  He has a history of mental health issues.  Was seen here and discharged last week.  Per the IVC the patient has had increased paranoid thoughts, auditory hallucinations and has been punching walls.  Patient denies this and has no complaints.  He denies any suicidal or homicidal ideations.  He denies that he is hearing voices.  The history is provided by the patient and the police.  Mental Health Problem Presenting symptoms: paranoid behavior   Patient accompanied by:  Law enforcement Relieved by:  None tried Associated symptoms: no abdominal pain, no chest pain and no headaches   Risk factors: hx of mental illness        Home Medications Prior to Admission medications   Medication Sig Start Date End Date Taking? Authorizing Provider  QUEtiapine (SEROQUEL) 100 MG tablet Take 1 tablet (100 mg total) by mouth daily. 12/10/23   Motley-Mangrum, Ezra Sites, PMHNP  QUEtiapine (SEROQUEL) 200 MG tablet Take 1 tablet (200 mg total) by mouth at bedtime. 12/09/23   Motley-Mangrum, Ezra Sites, PMHNP  traZODone (DESYREL) 50 MG tablet Take 1 tablet (50 mg total) by mouth at bedtime as needed for sleep. 12/09/23   Motley-Mangrum, Ezra Sites, PMHNP      Allergies    Depakote [divalproex sodium] and Latex    Review of Systems   Review of Systems  Cardiovascular:  Negative for chest pain.  Gastrointestinal:  Negative for abdominal pain.  Neurological:  Negative for headaches.  Psychiatric/Behavioral:  Positive for paranoia.     Physical Exam Updated Vital Signs BP (!) 150/96 (BP Location: Left Arm)   Pulse 98   Temp 98.9 F (37.2 C) (Oral)   Resp 18   Ht 6\' 2"  (1.88 m)   Wt 72 kg    SpO2 98%   BMI 20.38 kg/m  Physical Exam Vitals and nursing note reviewed.  Constitutional:      Appearance: Normal appearance. He is well-developed.  HENT:     Head: Normocephalic and atraumatic.  Eyes:     Conjunctiva/sclera: Conjunctivae normal.  Cardiovascular:     Rate and Rhythm: Normal rate and regular rhythm.     Heart sounds: No murmur heard. Pulmonary:     Effort: Pulmonary effort is normal. No respiratory distress.     Breath sounds: Normal breath sounds.  Abdominal:     Palpations: Abdomen is soft.     Tenderness: There is no abdominal tenderness. There is no guarding or rebound.  Musculoskeletal:        General: No deformity.     Cervical back: Neck supple.  Skin:    General: Skin is warm and dry.  Neurological:     General: No focal deficit present.     Mental Status: He is alert and oriented to person, place, and time.     GCS: GCS eye subscore is 4. GCS verbal subscore is 5. GCS motor subscore is 6.     Sensory: No sensory deficit.     Motor: No weakness.     ED Results / Procedures / Treatments   Labs (all labs ordered are listed, but only abnormal  results are displayed) Labs Reviewed  SALICYLATE LEVEL - Abnormal; Notable for the following components:      Result Value   Salicylate Lvl <7.0 (*)    All other components within normal limits  ACETAMINOPHEN LEVEL - Abnormal; Notable for the following components:   Acetaminophen (Tylenol), Serum <10 (*)    All other components within normal limits  URINALYSIS, ROUTINE W REFLEX MICROSCOPIC - Abnormal; Notable for the following components:   APPearance CLOUDY (*)    All other components within normal limits  COMPREHENSIVE METABOLIC PANEL  ETHANOL  RAPID URINE DRUG SCREEN, HOSP PERFORMED  CBC WITH DIFFERENTIAL/PLATELET    EKG None  Radiology No results found.  Procedures Procedures    Medications Ordered in ED Medications - No data to display  ED Course/ Medical Decision Making/ A&P                                  Medical Decision Making Amount and/or Complexity of Data Reviewed Labs: ordered.  Risk OTC drugs. Prescription drug management.   This patient complains of possible increased paranoid behavior disruptive; this involves an extensive number of treatment Options and is a complaint that carries with it a high risk of complications and morbidity. The differential includes mental health crisis, intoxication, metabolic derangement  I ordered, reviewed and interpreted labs, which included CBC normal chemistries normal alcohol and Tylenol negative, aspirin negative, urinalysis negative, talk screen negative I ordered medication patient's home medications and reviewed PMP when indicated. Previous records obtained and reviewed in epic including recent psychiatric evaluation I consulted behavioral health and discussed lab and imaging findings and discussed disposition.  Cardiac monitoring reviewed, sinus rhythm Social determinants considered, no significant barriers Critical Interventions: None  After the interventions stated above, I reevaluated the patient and found patient to be alert in no distress, does not appear to be responding to internal stimuli Admission and further testing considered, I have filed the first exam.  Patient will be held under IVC until psychiatry can evaluate patient and get collateral information.         Final Clinical Impression(s) / ED Diagnoses Final diagnoses:  Agitation    Rx / DC Orders ED Discharge Orders     None         Terrilee Files, MD 12/14/23 1724

## 2023-12-14 NOTE — ED Notes (Signed)
Pt given dinner tray.

## 2023-12-14 NOTE — Final Consult Note (Signed)
Attempted to see patient for psych assessment via tts cart.  While attempting to greet patient he interrupts and states, "I wanna go home. I don't care what your name is just talk."  Patient unable or unwilling to provide this writer his name.  No apparent distress noted.  Attempted to provide anticipatory guidance; while explaining to patient the goal to work together to determine next steps in his care he states, "you are annoying as fuck and I don't care about plaques you have on your wall."  Several attempts were made to verbally redirect patient but he demonstrates mounting agitation and refuses to participate in the assessment.  Will defer assessment for now.   After reviewing his chart, PRN medications were ordered.

## 2023-12-14 NOTE — Progress Notes (Signed)
12/04/2023  1144  Labs drawn and sent to main lab. Dark green, light green, purple, red, and gold tubes sent to main lab.

## 2023-12-15 MED ORDER — LORAZEPAM 2 MG/ML IJ SOLN
2.0000 mg | Freq: Once | INTRAMUSCULAR | Status: AC
  Administered 2023-12-15: 2 mg via INTRAMUSCULAR
  Filled 2023-12-15: qty 1

## 2023-12-15 NOTE — ED Notes (Signed)
Sheriff called and voice message left on voicemail for transportation to be set up for this pt going to [place] in the morning, phone number also left on voicemail and that pt could arrive after 9 am.

## 2023-12-15 NOTE — ED Notes (Signed)
Patient off unit to [facility] per provider. Patient alert, cooperative, and no s/s of distress at this time. Discharge information and belongings given to [facility] for transport. Patient ambulatory off unit, escorted and transported by Andilynn Delavega [facility].

## 2023-12-15 NOTE — ED Notes (Signed)
Pt to nurses station multiple times requesting vape and a person named Dow Adolph, pt educated about nicotine patch but still wanting vape. Pt then back to room but moments later comes out cussing, requesting to leave , pull on sappu wooden doors to leave , pt refused to stop, secuirty heard pt in TCU and came over , RN went to MD to request additional meds , see orders, pt medicated per [facility]
